# Patient Record
Sex: Female | Born: 1975 | Race: Black or African American | Hispanic: No | Marital: Single | Smoking: Former smoker
Health system: Southern US, Community
[De-identification: ages and names within clinical notes are randomized; demographics above are authoritative.]

## PROBLEM LIST (undated history)

## (undated) DIAGNOSIS — R600 Localized edema: Secondary | ICD-10-CM

## (undated) DIAGNOSIS — Z9109 Other allergy status, other than to drugs and biological substances: Secondary | ICD-10-CM

## (undated) HISTORY — DX: Localized edema: R60.0

## (undated) HISTORY — PX: CORNEAL TRANSPLANT: SHX108

## (undated) HISTORY — PX: CHOLECYSTECTOMY: SHX55

## (undated) HISTORY — PX: ABDOMINAL HYSTERECTOMY: SHX81

---

## 2000-01-10 ENCOUNTER — Emergency Department (HOSPITAL_COMMUNITY): Admission: EM | Admit: 2000-01-10 | Discharge: 2000-01-10 | Payer: Self-pay | Admitting: Emergency Medicine

## 2002-09-18 ENCOUNTER — Inpatient Hospital Stay (HOSPITAL_COMMUNITY): Admission: AD | Admit: 2002-09-18 | Discharge: 2002-09-18 | Payer: Self-pay | Admitting: Obstetrics & Gynecology

## 2004-06-19 ENCOUNTER — Inpatient Hospital Stay (HOSPITAL_COMMUNITY): Admission: AD | Admit: 2004-06-19 | Discharge: 2004-06-19 | Payer: Self-pay | Admitting: Family Medicine

## 2004-07-09 ENCOUNTER — Encounter (INDEPENDENT_AMBULATORY_CARE_PROVIDER_SITE_OTHER): Payer: Self-pay | Admitting: Specialist

## 2004-07-09 ENCOUNTER — Ambulatory Visit: Payer: Self-pay | Admitting: *Deleted

## 2005-12-19 ENCOUNTER — Emergency Department (HOSPITAL_COMMUNITY): Admission: EM | Admit: 2005-12-19 | Discharge: 2005-12-19 | Payer: Self-pay | Admitting: Emergency Medicine

## 2006-12-22 ENCOUNTER — Emergency Department (HOSPITAL_COMMUNITY): Admission: EM | Admit: 2006-12-22 | Discharge: 2006-12-22 | Payer: Self-pay | Admitting: Emergency Medicine

## 2008-02-02 ENCOUNTER — Emergency Department (HOSPITAL_COMMUNITY): Admission: EM | Admit: 2008-02-02 | Discharge: 2008-02-02 | Payer: Self-pay | Admitting: Emergency Medicine

## 2008-05-04 ENCOUNTER — Emergency Department (HOSPITAL_COMMUNITY): Admission: EM | Admit: 2008-05-04 | Discharge: 2008-05-04 | Payer: Self-pay | Admitting: Family Medicine

## 2008-09-04 ENCOUNTER — Encounter (HOSPITAL_COMMUNITY): Payer: Self-pay | Admitting: Family Medicine

## 2008-09-04 ENCOUNTER — Inpatient Hospital Stay (HOSPITAL_COMMUNITY): Admission: AD | Admit: 2008-09-04 | Discharge: 2008-09-04 | Payer: Self-pay | Admitting: Obstetrics & Gynecology

## 2008-09-04 ENCOUNTER — Emergency Department (HOSPITAL_COMMUNITY): Admission: EM | Admit: 2008-09-04 | Discharge: 2008-09-04 | Payer: Self-pay | Admitting: Emergency Medicine

## 2008-09-08 ENCOUNTER — Ambulatory Visit: Payer: Self-pay | Admitting: Obstetrics and Gynecology

## 2008-09-08 ENCOUNTER — Inpatient Hospital Stay (HOSPITAL_COMMUNITY): Admission: AD | Admit: 2008-09-08 | Discharge: 2008-09-08 | Payer: Self-pay | Admitting: Obstetrics & Gynecology

## 2009-04-17 ENCOUNTER — Inpatient Hospital Stay (HOSPITAL_COMMUNITY): Admission: AD | Admit: 2009-04-17 | Discharge: 2009-04-21 | Payer: Self-pay | Admitting: Obstetrics and Gynecology

## 2009-12-04 ENCOUNTER — Ambulatory Visit (HOSPITAL_COMMUNITY): Admission: RE | Admit: 2009-12-04 | Discharge: 2009-12-04 | Payer: Self-pay | Admitting: Obstetrics and Gynecology

## 2009-12-24 ENCOUNTER — Emergency Department (HOSPITAL_COMMUNITY): Admission: EM | Admit: 2009-12-24 | Discharge: 2009-12-24 | Payer: Self-pay | Admitting: Emergency Medicine

## 2010-01-01 ENCOUNTER — Ambulatory Visit (HOSPITAL_COMMUNITY): Admission: RE | Admit: 2010-01-01 | Discharge: 2010-01-02 | Payer: Self-pay | Admitting: Surgery

## 2010-01-01 ENCOUNTER — Encounter (INDEPENDENT_AMBULATORY_CARE_PROVIDER_SITE_OTHER): Payer: Self-pay | Admitting: Surgery

## 2010-01-21 ENCOUNTER — Emergency Department (HOSPITAL_COMMUNITY): Admission: EM | Admit: 2010-01-21 | Discharge: 2010-01-21 | Payer: Self-pay | Admitting: Family Medicine

## 2010-05-20 LAB — SURGICAL PCR SCREEN: MRSA, PCR: NEGATIVE

## 2010-05-20 LAB — DIFFERENTIAL
Basophils Absolute: 0 K/uL (ref 0.0–0.1)
Basophils Relative: 1 % (ref 0–1)
Eosinophils Absolute: 0.3 10*3/uL (ref 0.0–0.7)
Eosinophils Relative: 5 % (ref 0–5)
Lymphocytes Relative: 28 % (ref 12–46)
Lymphs Abs: 1.9 10*3/uL (ref 0.7–4.0)
Monocytes Absolute: 0.5 K/uL (ref 0.1–1.0)
Monocytes Relative: 7 % (ref 3–12)
Neutro Abs: 3.9 10*3/uL (ref 1.7–7.7)
Neutrophils Relative %: 59 % (ref 43–77)

## 2010-05-20 LAB — COMPREHENSIVE METABOLIC PANEL
ALT: 15 U/L (ref 0–35)
Calcium: 8.7 mg/dL (ref 8.4–10.5)
Creatinine, Ser: 0.66 mg/dL (ref 0.4–1.2)
GFR calc Af Amer: >60 mL/min (ref >60)
GFR calc non Af Amer: >60 mL/min (ref >60)
Glucose, Bld: 105 mg/dL — ABNORMAL HIGH (ref 70–99)
Sodium: 138 mEq/L (ref 135–145)
Total Protein: 6.7 g/dL (ref 6.0–8.3)

## 2010-05-20 LAB — URINALYSIS, ROUTINE W REFLEX MICROSCOPIC
Bilirubin Urine: NEGATIVE
Glucose, UA: NEGATIVE mg/dL
Hgb urine dipstick: NEGATIVE
Ketones, ur: NEGATIVE mg/dL
Nitrite: NEGATIVE
Protein, ur: NEGATIVE mg/dL
Specific Gravity, Urine: 1.019 (ref 1.005–1.030)
Urobilinogen, UA: 0.2 mg/dL (ref 0.0–1.0)
pH: 6.5 (ref 5.0–8.0)

## 2010-05-20 LAB — COMPREHENSIVE METABOLIC PANEL WITH GFR
AST: 31 U/L (ref 0–37)
Albumin: 3.4 g/dL — ABNORMAL LOW (ref 3.5–5.2)
Alkaline Phosphatase: 59 U/L (ref 39–117)
BUN: 11 mg/dL (ref 6–23)
CO2: 26 meq/L (ref 19–32)
Chloride: 106 meq/L (ref 96–112)
Potassium: 3.6 meq/L (ref 3.5–5.1)
Total Bilirubin: 0.3 mg/dL (ref 0.3–1.2)

## 2010-05-20 LAB — CBC
HCT: 36.4 % (ref 36.0–46.0)
Hemoglobin: 12.3 g/dL (ref 12.0–15.0)
MCH: 30.3 pg (ref 26.0–34.0)
MCHC: 33.9 g/dL (ref 30.0–36.0)
MCV: 89.5 fL (ref 78.0–100.0)
Platelets: 263 K/uL (ref 150–400)
RBC: 4.06 MIL/uL (ref 3.87–5.11)
RDW: 15.9 % — ABNORMAL HIGH (ref 11.5–15.5)
WBC: 6.7 10*3/uL (ref 4.0–10.5)

## 2010-05-20 LAB — URINE MICROSCOPIC-ADD ON

## 2010-05-20 LAB — PREGNANCY, URINE: Preg Test, Ur: NEGATIVE

## 2010-05-20 LAB — LIPASE, BLOOD: Lipase: 22 U/L (ref 11–59)

## 2010-05-27 LAB — CBC
HCT: 28.2 % — ABNORMAL LOW (ref 36.0–46.0)
HCT: 34 % — ABNORMAL LOW (ref 36.0–46.0)
Hemoglobin: 11.4 g/dL — ABNORMAL LOW (ref 12.0–15.0)
MCHC: 33.5 g/dL (ref 30.0–36.0)
MCHC: 33.6 g/dL (ref 30.0–36.0)
MCV: 92.6 fL (ref 78.0–100.0)
Platelets: 139 10*3/uL — ABNORMAL LOW (ref 150–400)
RBC: 3.71 MIL/uL — ABNORMAL LOW (ref 3.87–5.11)
RDW: 15.6 % — ABNORMAL HIGH (ref 11.5–15.5)
WBC: 12.7 10*3/uL — ABNORMAL HIGH (ref 4.0–10.5)

## 2010-06-14 LAB — HCG, QUANTITATIVE, PREGNANCY: hCG, Beta Chain, Quant, S: 18095 m[IU]/mL — ABNORMAL HIGH (ref <5)

## 2010-06-15 LAB — POCT URINALYSIS DIP (DEVICE)
Bilirubin Urine: NEGATIVE
Ketones, ur: NEGATIVE mg/dL
Nitrite: NEGATIVE
Protein, ur: NEGATIVE mg/dL
pH: 6 (ref 5.0–8.0)

## 2010-06-15 LAB — URINE CULTURE: Colony Count: 1000

## 2010-06-15 LAB — CBC
Hemoglobin: 12.4 g/dL (ref 12.0–15.0)
MCHC: 34.4 g/dL (ref 30.0–36.0)
MCV: 92.9 fL (ref 78.0–100.0)
RBC: 3.88 MIL/uL (ref 3.87–5.11)
WBC: 7.6 10*3/uL (ref 4.0–10.5)

## 2010-06-15 LAB — HCG, QUANTITATIVE, PREGNANCY: hCG, Beta Chain, Quant, S: 7878 m[IU]/mL — ABNORMAL HIGH (ref <5)

## 2010-06-15 LAB — WET PREP, GENITAL: Yeast Wet Prep HPF POC: NONE SEEN

## 2010-06-15 LAB — GC/CHLAMYDIA PROBE AMP, GENITAL: Chlamydia, DNA Probe: NEGATIVE

## 2010-06-23 LAB — POCT INFECTIOUS MONO SCREEN: Mono Screen: POSITIVE — AB

## 2011-02-03 ENCOUNTER — Emergency Department (HOSPITAL_COMMUNITY)
Admission: EM | Admit: 2011-02-03 | Discharge: 2011-02-03 | Disposition: A | Discharge status: Home / Self Care | Payer: PRIVATE HEALTH INSURANCE | Attending: Emergency Medicine | Admitting: Emergency Medicine

## 2011-02-03 ENCOUNTER — Encounter: Payer: Self-pay | Admitting: *Deleted

## 2011-02-03 DIAGNOSIS — F17200 Nicotine dependence, unspecified, uncomplicated: Secondary | ICD-10-CM | POA: Insufficient documentation

## 2011-02-03 DIAGNOSIS — W260XXA Contact with knife, initial encounter: Secondary | ICD-10-CM | POA: Insufficient documentation

## 2011-02-03 DIAGNOSIS — S61219A Laceration without foreign body of unspecified finger without damage to nail, initial encounter: Secondary | ICD-10-CM

## 2011-02-03 DIAGNOSIS — Y9229 Other specified public building as the place of occurrence of the external cause: Secondary | ICD-10-CM | POA: Insufficient documentation

## 2011-02-03 DIAGNOSIS — W261XXA Contact with sword or dagger, initial encounter: Secondary | ICD-10-CM | POA: Insufficient documentation

## 2011-02-03 DIAGNOSIS — S61209A Unspecified open wound of unspecified finger without damage to nail, initial encounter: Secondary | ICD-10-CM | POA: Insufficient documentation

## 2011-02-03 MED ORDER — BACITRACIN 500 UNIT/GM EX OINT
1.0000 "application " | TOPICAL_OINTMENT | Freq: Every day | CUTANEOUS | Status: DC
  Filled 2011-02-03: qty 0.9

## 2011-02-03 MED ORDER — LIDOCAINE HCL (PF) 1 % IJ SOLN
20.0000 mL | Freq: Once | INTRAMUSCULAR | Status: AC
  Administered 2011-02-03: 20 mL
  Filled 2011-02-03: qty 20

## 2011-02-03 MED ORDER — BACITRACIN ZINC 500 UNIT/GM EX OINT
1.0000 "application " | TOPICAL_OINTMENT | Freq: Every day | CUTANEOUS | Status: DC
  Administered 2011-02-03: 1 via TOPICAL

## 2011-02-03 MED ORDER — TETANUS-DIPHTH-ACELL PERTUSSIS 5-2.5-18.5 LF-MCG/0.5 IM SUSP
0.5000 mL | Freq: Once | INTRAMUSCULAR | Status: AC
  Administered 2011-02-03: 0.5 mL via INTRAMUSCULAR
  Filled 2011-02-03: qty 0.5

## 2011-02-03 MED ORDER — ACETAMINOPHEN 325 MG PO TABS
650.0000 mg | ORAL_TABLET | Freq: Once | ORAL | Status: AC
  Administered 2011-02-03: 650 mg via ORAL
  Filled 2011-02-03: qty 2

## 2011-02-03 MED ORDER — BACITRACIN 500 UNIT/GM EX OINT
1.0000 "application " | TOPICAL_OINTMENT | CUTANEOUS | Status: DC
  Filled 2011-02-03: qty 0.9

## 2011-02-03 NOTE — ED Provider Notes (Signed)
History     CSN: [id number] Arrival date & time: 02/03/2011  1:32 PM   First MD Initiated Contact with Patient 02/03/11 1536      Chief Complaint  Patient presents with  . Extremity Laceration    (Consider location/radiation/quality/duration/timing/severity/associated sxs/prior treatment) HPI Comments: Patient reports she was working in [employer] and cut her finger with a knife.  Denies other injury. Denies weakness, numbness, difficulty moving finger.  Pain radiates up entire arm.  Last tetanus was Oct 2007.   The history is provided by the patient.    History reviewed. No pertinent past medical history.  Past Surgical History  Procedure Date  . Cesarean section   . Corneal transplant   . Cholecystectomy     No family history on file.  History  Substance Use Topics  . Smoking status: Current Everyday Smoker -- 0.5 packs/day  . Smokeless tobacco: Not on file  . Alcohol Use: Yes     socially    OB History    Grav Para Term Preterm Abortions TAB SAB Ect Mult Living                  Review of Systems  All other systems reviewed and are negative.    Allergies  Shellfish allergy and Aspirin  Home Medications  No current outpatient prescriptions on file.  BP 103/67  Pulse 77  Temp(Src) 98.6 F (37 C) (Oral)  Resp 18  Wt 267 lb (121.11 kg)  SpO2 99%  LMP 01/20/2011  Physical Exam  Constitutional: She is oriented to person, place, and time. She appears well-developed and well-nourished.  HENT:  Head: Normocephalic and atraumatic.  Neck: Neck supple.  Pulmonary/Chest: Effort normal.  Neurological: She is alert and oriented to person, place, and time.  Skin:       Simple linear laceration over dorsal right 3rd digit, proximal phalanx.  Full ROM of digit, full strength, capillary refill < 2 seconds.      ED Course  Procedures (including critical care time) LACERATION REPAIR Performed by: Rise Patience Consent: Verbal consent obtained. Risks and  benefits: risks, benefits and alternatives were discussed Patient identity confirmed: provided demographic data Time out performed prior to procedure Prepped and Draped in normal sterile fashion Wound explored  Laceration Location: right dorsal 3rd finger  Laceration Length: 2cm  No Foreign Bodies seen or palpated  Anesthesia: digital block  Local anesthetic: lidocaine 1% NO epinephrine  Irrigation method: syringe Amount of cleaning: standard  Skin closure: ethilon 5-0  Number of sutures or staples: 5  Technique: simple interrupted  Patient tolerance: Patient tolerated the procedure well with no immediate complications.    Labs Reviewed - No data to display No results found.   1. 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  Pt is right hand dominant, no tendon injury.  Laceration is simple, pt tolerated repair well.          Dillard Cannon [place], [place] 02/03/11 [phone]

## 2011-02-03 NOTE — ED Notes (Signed)
Pt states "the knife slipped out of my hand and cut the other hand"; pt from hospital dining; pt presents with laceration to right hand, 3rd digit posteriorly, bleeding controlled @ present

## 2011-02-04 NOTE — ED Provider Notes (Signed)
Medical screening examination/treatment/procedure(s) were performed by non-physician practitioner and as supervising physician I was immediately available for consultation/collaboration.   Adlee Paar, MD 02/04/11 0651 

## 2011-02-13 ENCOUNTER — Emergency Department (HOSPITAL_COMMUNITY)
Admission: EM | Admit: 2011-02-13 | Discharge: 2011-02-13 | Discharge status: Against Medical Advice | Payer: PRIVATE HEALTH INSURANCE

## 2011-02-14 ENCOUNTER — Emergency Department (HOSPITAL_COMMUNITY)
Admission: EM | Admit: 2011-02-14 | Discharge: 2011-02-14 | Disposition: A | Discharge status: Home / Self Care | Payer: PRIVATE HEALTH INSURANCE | Attending: Emergency Medicine | Admitting: Emergency Medicine

## 2011-02-14 ENCOUNTER — Encounter (HOSPITAL_COMMUNITY): Payer: Self-pay | Admitting: *Deleted

## 2011-02-14 DIAGNOSIS — IMO0002 Reserved for concepts with insufficient information to code with codable children: Secondary | ICD-10-CM

## 2011-02-14 DIAGNOSIS — Z4802 Encounter for removal of sutures: Secondary | ICD-10-CM | POA: Insufficient documentation

## 2011-02-14 NOTE — ED Notes (Signed)
Suture removal rt middle finger

## 2011-02-14 NOTE — ED Provider Notes (Signed)
History     CSN: [id number] Arrival date & time: 02/14/2011  2:30 PM   First MD Initiated Contact with Patient 02/14/11 1509      Chief Complaint  Patient presents with  . Suture / Staple Removal     HPI Reports here for suture removal. States was seen 02/03/2011 and had 5 sutures placed to her (R) middle finger s/p lac sustained w/ knife while cutting food.   History reviewed. No pertinent past medical history.  Past Surgical History  Procedure Date  . Cesarean section   . Corneal transplant   . Cholecystectomy     No family history on file.  History  Substance Use Topics  . Smoking status: Current Everyday Smoker -- 0.5 packs/day  . Smokeless tobacco: Not on file  . Alcohol Use: Yes     socially    OB History    Grav Para Term Preterm Abortions TAB SAB Ect Mult Living                  Review of Systems  Constitutional: Negative.   HENT: Negative.   Eyes: Negative.   Respiratory: Negative.   Cardiovascular: Negative.   Gastrointestinal: Negative.   Genitourinary: Negative.   Musculoskeletal: Negative.   Skin: Negative.   Neurological: Negative.   Hematological: Negative.   Psychiatric/Behavioral: Negative.     Allergies  Shellfish allergy and Aspirin  Home Medications  No current outpatient prescriptions on file.  BP 132/77  Pulse 86  Temp(Src) 97.7 F (36.5 C) (Oral)  SpO2 100%  LMP 01/20/2011  Physical Exam  Constitutional: She is oriented to person, place, and time. She appears well-developed and well-nourished.  HENT:  Head: Normocephalic and atraumatic.  Eyes: Conjunctivae are normal.  Neck: Neck supple.  Musculoskeletal: Normal range of motion.       Hands:      Well healed wound to dorsal (R) 3rd finger w/ 5 sutures in place.  Neurological: She is alert and oriented to person, place, and time. She has normal reflexes.  Skin: Skin is warm and dry.  Psychiatric: She has a normal mood and affect.    ED Course  SUTURE  REMOVAL Date/Time: 02/14/2011 3:38 PM Performed by: Leanne Chang Authorized by: Leanne Chang Consent: Verbal consent obtained. Consent given by: patient Patient understanding: patient states understanding of the procedure being performed Body area: upper extremity Location details: right hand Wound Appearance: clean Sutures Removed: 5 Post-removal: antibiotic ointment applied and dressing applied   Well healed wound to (R) 3rd finger.   Labs Reviewed - No data to display No results found.   No diagnosis found.    MDM  Well healed wound s/p suturing on 02/03/2011. Sutures removed.        Leanne Chang, NP 02/14/11 1540

## 2011-02-15 NOTE — ED Provider Notes (Signed)
Evaluation and management procedures were performed by the mid-level provider (PA/NP/CNM) under my supervision/collaboration. I was present and available during the ED course. Joei Frangos Y.   Brewster Wolters Y. Reginna Sermeno, MD 02/15/11 1146 

## 2011-03-28 ENCOUNTER — Emergency Department (INDEPENDENT_AMBULATORY_CARE_PROVIDER_SITE_OTHER)
Admission: EM | Admit: 2011-03-28 | Discharge: 2011-03-28 | Disposition: A | Discharge status: Home / Self Care | Payer: PRIVATE HEALTH INSURANCE | Source: Home / Self Care

## 2011-03-28 ENCOUNTER — Encounter (HOSPITAL_COMMUNITY): Payer: Self-pay | Admitting: *Deleted

## 2011-03-28 DIAGNOSIS — B349 Viral infection, unspecified: Secondary | ICD-10-CM

## 2011-03-28 DIAGNOSIS — B9789 Other viral agents as the cause of diseases classified elsewhere: Secondary | ICD-10-CM

## 2011-03-28 MED ORDER — OSELTAMIVIR PHOSPHATE 75 MG PO CAPS: 75.0000 mg | ORAL_CAPSULE | Freq: Two times a day (BID) | ORAL | Status: AC

## 2011-03-28 NOTE — ED Provider Notes (Signed)
History     CSN: [id number]  Arrival date & time 03/28/11  1735   None     Chief Complaint  Patient presents with  . Diarrhea  . Chills  . Generalized Body Aches    (Consider location/radiation/quality/duration/timing/severity/associated sxs/prior treatment) HPI Comments: Pt became ill 2 days ago, had flu shot this year but feels like has flu.  Son was in hospital 2 night ago, so pt ignored own sx.   Patient is a 36 y.o. female presenting with URI. The history is provided by the patient.  URI The primary symptoms include fever, headaches, sore throat, cough and myalgias. Primary symptoms do not include ear pain, wheezing, abdominal pain, nausea or vomiting. The current episode started 2 days ago. This is a new problem.  The fever began 2 days ago. The maximum temperature recorded prior to her arrival was unknown.  The headache began 2 days ago. Headache is a new problem. The pain from the headache is at a severity of 8/10.  The sore throat began 2 days ago. The sore throat is moderate in intensity.  The cough began 2 days ago. The cough is non-productive.  The onset of the illness is associated with exposure to sick contacts. Symptoms associated with the illness include chills, sinus pressure and congestion.    Past Medical History  Diagnosis Date  . Asthma     Past Surgical History  Procedure Date  . Cesarean section   . Corneal transplant   . Cholecystectomy     History reviewed. No pertinent family history.  History  Substance Use Topics  . Smoking status: Current Everyday Smoker -- 0.5 packs/day  . Smokeless tobacco: Not on file  . Alcohol Use: Yes     socially    OB History    Grav Para Term Preterm Abortions TAB SAB Ect Mult Living                  Review of Systems  Constitutional: Positive for fever and chills.  HENT: Positive for congestion, sore throat, postnasal drip and sinus pressure. Negative for ear pain.   Respiratory: Positive for cough.  Negative for wheezing.   Cardiovascular: Positive for chest pain.       With coughing  Gastrointestinal: Positive for diarrhea. Negative for nausea, vomiting and abdominal pain.  Musculoskeletal: Positive for myalgias.  Neurological: Positive for headaches.    Allergies  Shellfish allergy and Aspirin  Home Medications   Current Outpatient Rx  Name Route Sig Dispense Refill  . PREDNISOLONE ACETATE 1 % OP SUSP  1 drop 3 (three) times daily.      BP 130/80  Pulse 94  Temp(Src) 99.9 F (37.7 C) (Oral)  Resp 18  SpO2 98%  LMP 03/22/2011  Physical Exam  Constitutional: She appears well-developed and well-nourished. She appears ill.  Cardiovascular: Normal rate and regular rhythm.   Pulmonary/Chest: Effort normal and breath sounds normal.       coughing  Abdominal: There is no tenderness.  Lymphadenopathy:       Head (right side): Submandibular adenopathy present.       Head (left side): Submandibular adenopathy present.    She has no cervical adenopathy.    ED Course  Procedures (including critical care time)  Although pt received flu shot this year, sx c/w flu. Per CDC recommendations this year, will tx with tamiflu.    Labs Reviewed - No data to display No results found.   No diagnosis found.  MDM          Cathlyn Parsons, NP 03/28/11 1844

## 2011-03-28 NOTE — ED Notes (Signed)
Started w/ body aches and chills 3 days ago w/ cough and sneezing.  C/O chest pain, sore throat, diarrhea, poor appetite.  Unsure how high fevers have been.  Has been taken one dose Mucinex.

## 2011-03-30 NOTE — ED Provider Notes (Signed)
Medical screening examination/treatment/procedure(s) were performed by non-physician practitioner and as supervising physician I was immediately available for consultation/collaboration.   Tyresha Fede DOUGLAS MD.    Joslynne Klatt Douglas Natiya Seelinger, MD 03/30/11 0811 

## 2012-02-01 ENCOUNTER — Encounter (HOSPITAL_COMMUNITY): Payer: Self-pay | Admitting: *Deleted

## 2012-02-01 ENCOUNTER — Inpatient Hospital Stay (HOSPITAL_COMMUNITY): Payer: 59

## 2012-02-01 ENCOUNTER — Inpatient Hospital Stay (HOSPITAL_COMMUNITY)
Admission: AD | Admit: 2012-02-01 | Discharge: 2012-02-01 | Disposition: A | Discharge status: Home / Self Care | Payer: 59 | Source: Ambulatory Visit | Attending: Obstetrics & Gynecology | Admitting: Obstetrics & Gynecology

## 2012-02-01 DIAGNOSIS — N939 Abnormal uterine and vaginal bleeding, unspecified: Secondary | ICD-10-CM

## 2012-02-01 DIAGNOSIS — D259 Leiomyoma of uterus, unspecified: Secondary | ICD-10-CM

## 2012-02-01 DIAGNOSIS — N938 Other specified abnormal uterine and vaginal bleeding: Secondary | ICD-10-CM | POA: Insufficient documentation

## 2012-02-01 DIAGNOSIS — R109 Unspecified abdominal pain: Secondary | ICD-10-CM | POA: Insufficient documentation

## 2012-02-01 DIAGNOSIS — N949 Unspecified condition associated with female genital organs and menstrual cycle: Secondary | ICD-10-CM | POA: Insufficient documentation

## 2012-02-01 LAB — URINALYSIS, ROUTINE W REFLEX MICROSCOPIC
Ketones, ur: NEGATIVE mg/dL
Leukocytes, UA: NEGATIVE
Nitrite: NEGATIVE
Urobilinogen, UA: 0.2 mg/dL (ref 0.0–1.0)
pH: 5.5 (ref 5.0–8.0)

## 2012-02-01 LAB — CBC
HCT: 34.6 % — ABNORMAL LOW (ref 36.0–46.0)
Hemoglobin: 11.5 g/dL — ABNORMAL LOW (ref 12.0–15.0)
MCV: 87.4 fL (ref 78.0–100.0)
RBC: 3.96 MIL/uL (ref 3.87–5.11)
RDW: 15.6 % — ABNORMAL HIGH (ref 11.5–15.5)
WBC: 6.8 10*3/uL (ref 4.0–10.5)

## 2012-02-01 LAB — URINE MICROSCOPIC-ADD ON

## 2012-02-01 LAB — WET PREP, GENITAL: Yeast Wet Prep HPF POC: NONE SEEN

## 2012-02-01 MED ORDER — OXYCODONE-ACETAMINOPHEN 5-325 MG PO TABS
1.0000 | ORAL_TABLET | Freq: Once | ORAL | Status: AC
  Administered 2012-02-01: 1 via ORAL
  Filled 2012-02-01: qty 1

## 2012-02-01 MED ORDER — NORGESTIMATE-ETH ESTRADIOL 0.25-35 MG-MCG PO TABS: 1.0000 | ORAL_TABLET | Freq: Every day | ORAL | Status: DC

## 2012-02-01 MED ORDER — OXYCODONE-ACETAMINOPHEN 5-325 MG PO TABS: 1.0000 | ORAL_TABLET | ORAL | Status: DC | PRN

## 2012-02-01 NOTE — MAU Note (Signed)
Pt presents with complaint of irregular vaginal bleeding. States LMP was 10/18-10/25, stopped for 4 days and then started bleeding again and has had bleeding every day since, states passing clots at times. Today has used 3 tampons. Lower abd pain x 3-4 days off/on.

## 2012-02-01 NOTE — MAU Provider Note (Signed)
History     CSN: [id number]  Arrival date and time: 02/01/12 2043   None     Chief Complaint  Patient presents with  . Vaginal Bleeding  . Abdominal Pain   HPI Julie Munoz is a 36 y.o. female who presents to MAU with vaginal bleeding. The bleeding started 3 weeks ago. She states she had a normal period 10/18 and then a few days later started bleeding again. Some days the bleeding is heavy and some days not.  She has been using 3 to 4 tampons a day. Associated symptoms include abdominal pain, headache and feeling weak.  Last pap smear about 2 years ago and was normal. Current sex partner x 4 months. No history of STI's but request testing tonight.   OB History    Grav Para Term Preterm Abortions TAB SAB Ect Mult Living   1 1        1       Past Medical History  Diagnosis Date  . Asthma     Past Surgical History  Procedure Date  . Cesarean section   . Corneal transplant   . Cholecystectomy     History reviewed. No pertinent family history.  History  Substance Use Topics  . Smoking status: Current Every Day Smoker -- 0.5 packs/day  . Smokeless tobacco: Not on file  . Alcohol Use: Yes     Comment: socially    Allergies:  Allergies  Allergen Reactions  . Shellfish Allergy Anaphylaxis  . Aspirin Other (See Comments)    Allergy unknown    Prescriptions prior to admission  Medication Sig Dispense Refill  . prednisoLONE acetate (PRED FORTE) 1 % ophthalmic suspension 1 drop 3 (three) times daily.        Review of Systems  Constitutional: Negative for fever, chills, weight loss and malaise/fatigue (tired).  HENT: Negative for ear pain, nosebleeds, congestion, sore throat and neck pain.   Eyes: Negative for blurred vision, double vision, photophobia and pain.  Respiratory: Negative for cough, shortness of breath and wheezing.   Cardiovascular: Negative for chest pain, palpitations and leg swelling.  Gastrointestinal: Positive for abdominal pain. Negative  for heartburn, nausea, vomiting, diarrhea and constipation.  Genitourinary: Negative for dysuria, urgency and frequency.  Musculoskeletal: Positive for back pain. Negative for myalgias.  Skin: Negative for itching and rash.  Neurological: Positive for headaches. Negative for dizziness, sensory change, speech change, seizures and weakness.  Endo/Heme/Allergies: Does not bruise/bleed easily.  Psychiatric/Behavioral: Negative for depression and substance abuse. The patient is not nervous/anxious and does not have insomnia.    Physical Exam   Blood pressure 113/69, pulse 76, temperature 98.4 F (36.9 C), temperature source Oral, resp. rate 18, height 5\' 6"  (1.676 m), weight 271 lb (122.925 kg), last menstrual period 12/24/2011, SpO2 99.00%.  Physical Exam  Nursing note and vitals reviewed. Constitutional: She is oriented to person, place, and time. No distress.       Obese A/A female  HENT:  Head: Normocephalic and atraumatic.  Eyes: EOM are normal.  Neck: Neck supple.  Cardiovascular: Normal rate.   Respiratory: Effort normal.  GI: Soft. There is no tenderness.  Genitourinary:       External genitalia without lesions. Scant blood vaginal vault. Positive CMT, bilateral adnexal tenderness. Unable to palpate uterus due to patient habitus  Musculoskeletal: Normal range of motion.  Neurological: She is alert and oriented to person, place, and time.  Skin: Skin is warm and dry.  Psychiatric: She has a normal  mood and affect. Her behavior is normal. Judgment and thought content normal.   Results for orders placed during the hospital encounter of 02/01/12 (from the past 24 hour(s))  URINALYSIS, ROUTINE W REFLEX MICROSCOPIC     Status: Abnormal   Collection Time   02/01/12  9:00 PM      Component Value Range   Color, Urine YELLOW  YELLOW   APPearance CLEAR  CLEAR   Specific Gravity, Urine 1.025  1.005 - 1.030   pH 5.5  5.0 - 8.0   Glucose, UA NEGATIVE  NEGATIVE mg/dL   Hgb urine dipstick  LARGE (*) NEGATIVE   Bilirubin Urine NEGATIVE  NEGATIVE   Ketones, ur NEGATIVE  NEGATIVE mg/dL   Protein, ur NEGATIVE  NEGATIVE mg/dL   Urobilinogen, UA 0.2  0.0 - 1.0 mg/dL   Nitrite NEGATIVE  NEGATIVE   Leukocytes, UA NEGATIVE  NEGATIVE  URINE MICROSCOPIC-ADD ON     Status: Abnormal   Collection Time   02/01/12  9:00 PM      Component Value Range   Squamous Epithelial / LPF FEW (*) RARE   WBC, UA 0-2  <3 WBC/hpf   RBC / HPF 0-2  <3 RBC/hpf   Bacteria, UA RARE  RARE  CBC     Status: Abnormal   Collection Time   02/01/12  9:15 PM      Component Value Range   WBC 6.8  4.0 - 10.5 K/uL   RBC 3.96  3.87 - 5.11 MIL/uL   Hemoglobin 11.5 (*) 12.0 - 15.0 g/dL   HCT 16.1 (*) 09.6 - 04.5 %   MCV 87.4  78.0 - 100.0 fL   MCH 29.0  26.0 - 34.0 pg   MCHC 33.2  30.0 - 36.0 g/dL   RDW 40.9 (*) 81.1 - 91.4 %   Platelets 252  150 - 400 K/uL  WET PREP, GENITAL     Status: Abnormal   Collection Time   02/01/12  9:55 PM      Component Value Range   Yeast Wet Prep HPF POC NONE SEEN  NONE SEEN   Trich, Wet Prep NONE SEEN  NONE SEEN   Clue Cells Wet Prep HPF POC FEW (*) NONE SEEN   WBC, Wet Prep HPF POC FEW (*) NONE SEEN    US Transvaginal Non-ob  02/01/2012  *RADIOLOGY REPORT*  Clinical Data: Pelvic pain.  Vaginal bleeding.  TRANSABDOMINAL AND TRANSVAGINAL ULTRASOUND OF PELVIS Technique:  Both transabdominal and transvaginal ultrasound examinations of the pelvis were performed. Transabdominal technique was performed for global imaging of the pelvis including uterus, ovaries, adnexal regions, and pelvic cul-de-sac.  It was necessary to proceed with endovaginal exam following the transabdominal exam to visualize the uterus and ovaries.  Comparison:  No priors.  Findings:  Uterus: 9.4 x 5.2 x 6.8 cm.  Large heterogeneously isoechoic lesion and measuring 3.0 x 3.9 x 3.4 cm in the mid body distorting the endometrial stripe, most compatible with a large submucosal fibroid.  Endometrium: Measures up to 9.7  mm in thickness.  Distorted by the large submucosal fibroid.  Right ovary:  Cannot be visualized.  Left ovary: Normal in echotexture and appearance measuring 4.3 x 3.0 x 2.2 cm.  Small follicles.  Other findings: No significant free fluid in the cul-de-sac.  IMPRESSION: 1.  Large submucosal fibroid in the mid body of the uterus. 2.  Left ovary is normal in appearance. 3.  Limited examination which did not visualize the right ovary.  Original Report Authenticated By: Trudie Reed, M.D.    US Pelvis Complete  02/01/2012  *RADIOLOGY REPORT*  Clinical Data: Pelvic pain.  Vaginal bleeding.  TRANSABDOMINAL AND TRANSVAGINAL ULTRASOUND OF PELVIS Technique:  Both transabdominal and transvaginal ultrasound examinations of the pelvis were performed. Transabdominal technique was performed for global imaging of the pelvis including uterus, ovaries, adnexal regions, and pelvic cul-de-sac.  It was necessary to proceed with endovaginal exam following the transabdominal exam to visualize the uterus and ovaries.  Comparison:  No priors.  Findings:  Uterus: 9.4 x 5.2 x 6.8 cm.  Large heterogeneously isoechoic lesion and measuring 3.0 x 3.9 x 3.4 cm in the mid body distorting the endometrial stripe, most compatible with a large submucosal fibroid.  Endometrium: Measures up to 9.7 mm in thickness.  Distorted by the large submucosal fibroid.  Right ovary:  Cannot be visualized.  Left ovary: Normal in echotexture and appearance measuring 4.3 x 3.0 x 2.2 cm.  Small follicles.  Other findings: No significant free fluid in the cul-de-sac.  IMPRESSION: 1.  Large submucosal fibroid in the mid body of the uterus. 2.  Left ovary is normal in appearance. 3.  Limited examination which did not visualize the right ovary.   Original Report Authenticated By: Trudie Reed, M.D.     Assessment: 36 y.o. female with abnormal vaginal bleeding and pelvic pain   Uterine fibroid  Plan:  OC's and follow up in the GYN Clinic in 2  weeks   Discussed with Dr. Debroah Loop  I have reviewed this patient's vital signs, nurses notes, appropriate labs and imaging. I have discussed the results with the patient and plan of care and she voices understanding.   Medication List     As of 02/01/2012 10:56 PM    START taking these medications         norgestimate-ethinyl estradiol 0.25-35 MG-MCG tablet   Commonly known as: ORTHO-CYCLEN,SPRINTEC,PREVIFEM   Take 1 tablet by mouth daily.      CONTINUE taking these medications         HYDROcodone-acetaminophen 5-325 MG per tablet   Commonly known as: NORCO/VICODIN      oxyCODONE-acetaminophen 5-325 MG per tablet   Commonly known as: PERCOCET/ROXICET      prednisoLONE acetate 1 % ophthalmic suspension   Commonly known as: PRED FORTE          Where to get your medications    These are the prescriptions that you need to pick up. We sent them to a specific pharmacy, so you will need to go there to get them.   [facility] - [place], [place] - [address]    [address] [place] [address]    Phone: [phone]        norgestimate-ethinyl estradiol 0.25-35 MG-MCG tablet            MAU Course  Procedures Jaeson Molstad, RN, FNP, [facility] 02/01/2012, 9:25 PM

## 2012-02-02 ENCOUNTER — Encounter: Payer: Self-pay | Admitting: Obstetrics & Gynecology

## 2012-02-07 IMAGING — US US ABDOMEN COMPLETE
1 series · 13 of 25 positions shown · non-contrast
Comparison: None.

CLINICAL DATA: Right upper quadrant pain for 4 months.  Family
history of gallstones

COMPLETE ABDOMINAL ULTRASOUND

[Series 1: us abdomen complete · 0.26mm/px · 13 of 74 slices shown]
[im 1/74]
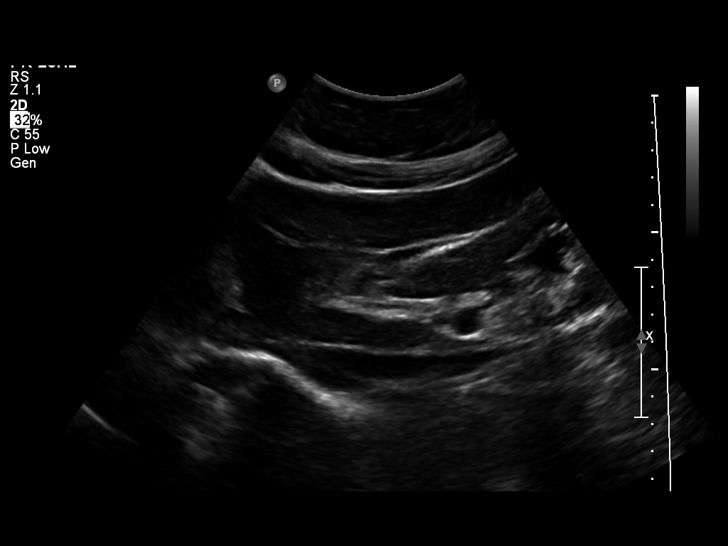
[im 7/74]
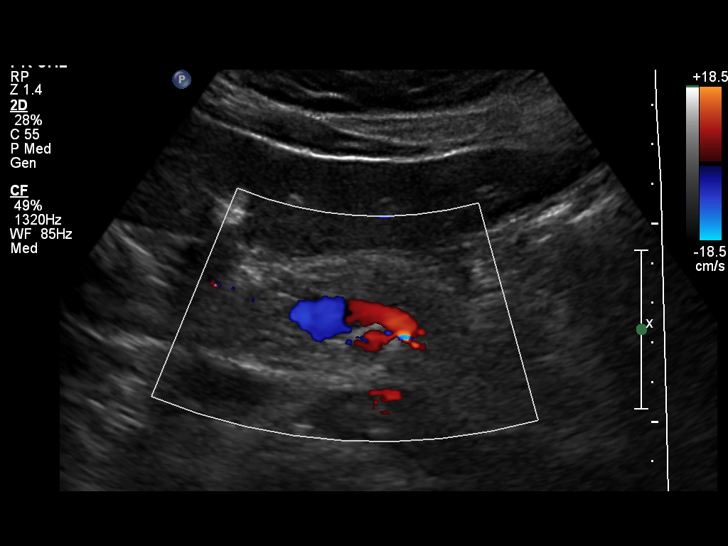
[im 13/74]
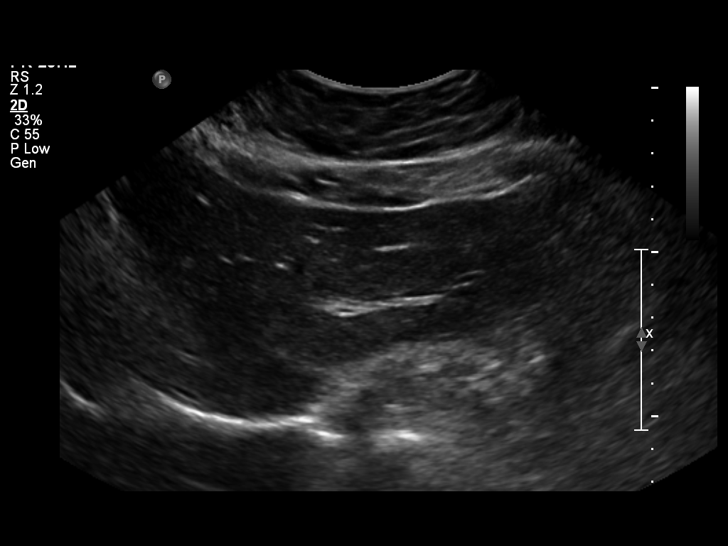
[im 19/74]
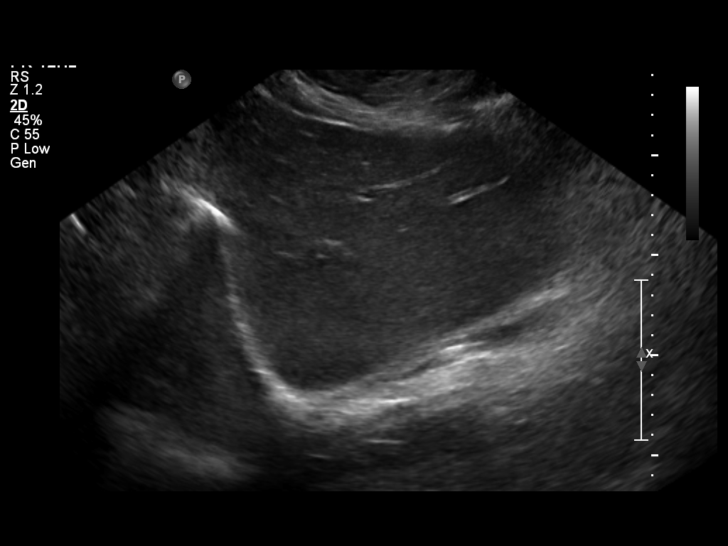
[im 25/74]
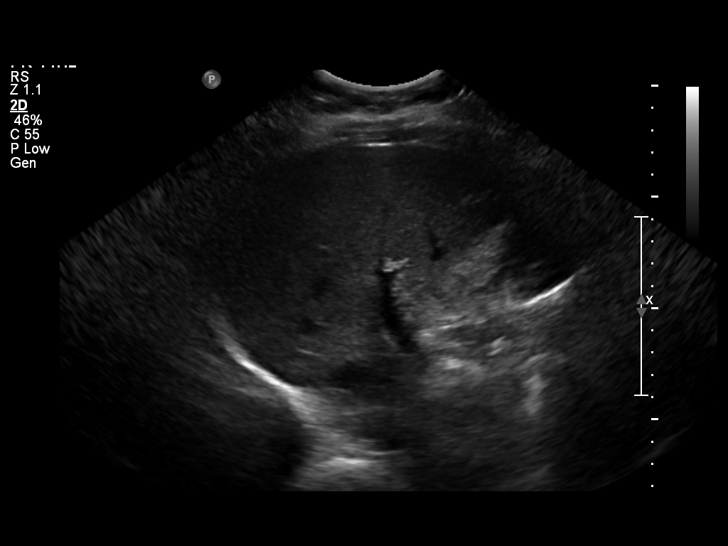
[im 31/74]
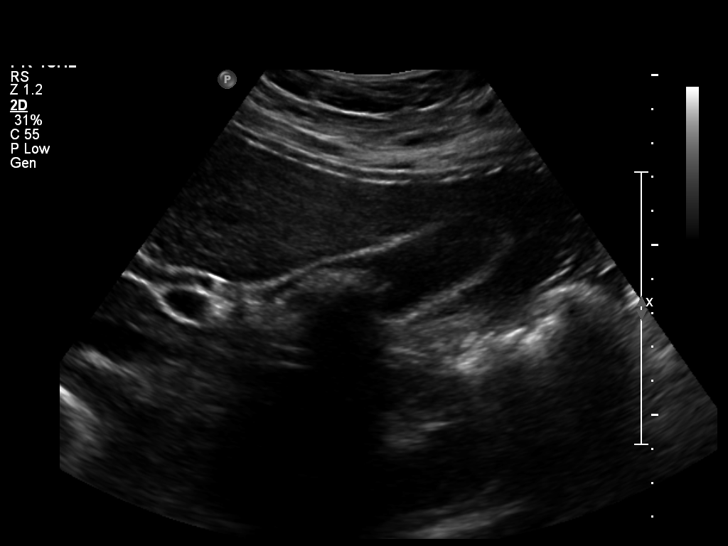
[im 37/74]
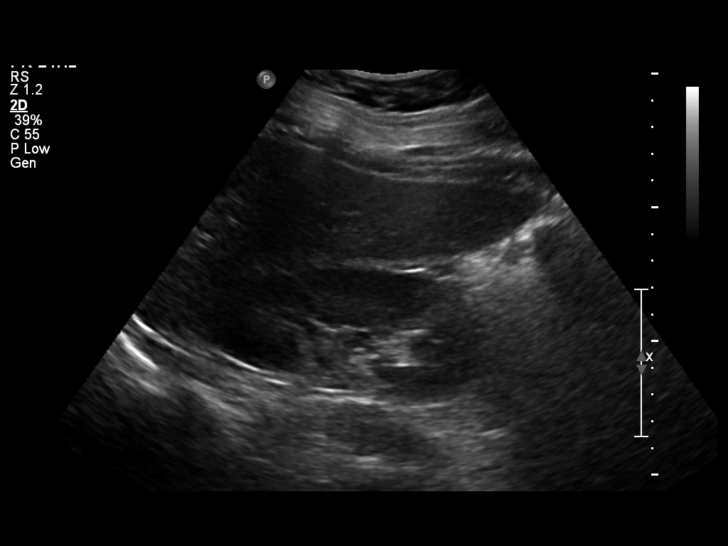
[im 43/74]
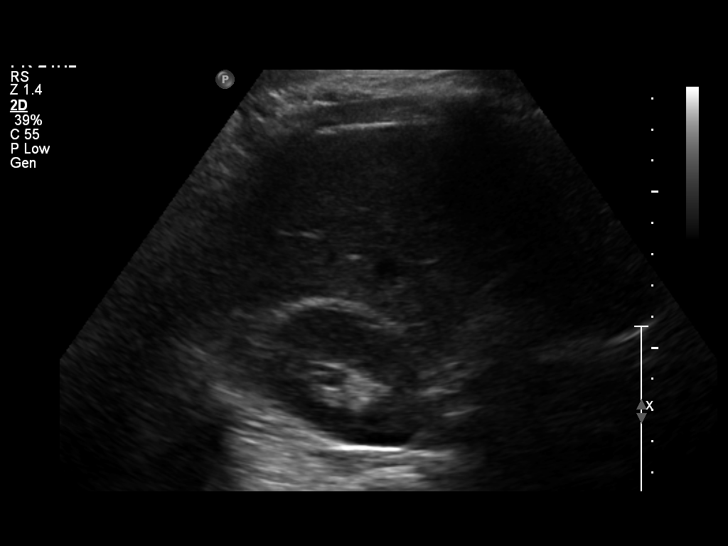
[im 49/74]
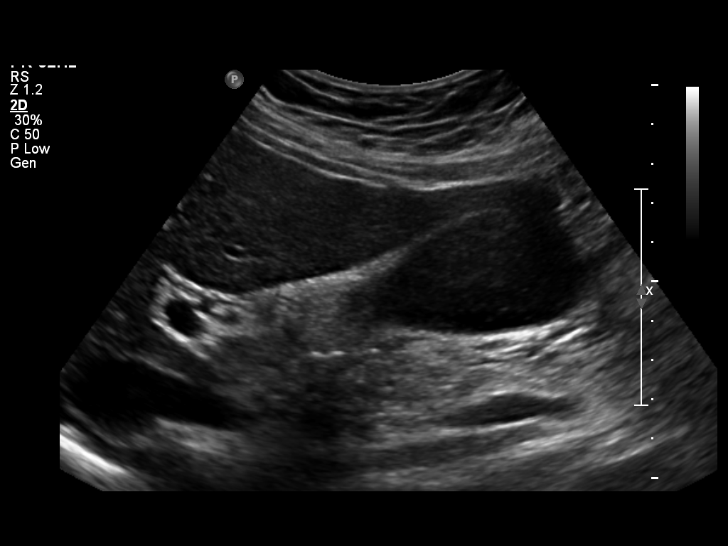
[im 55/74]
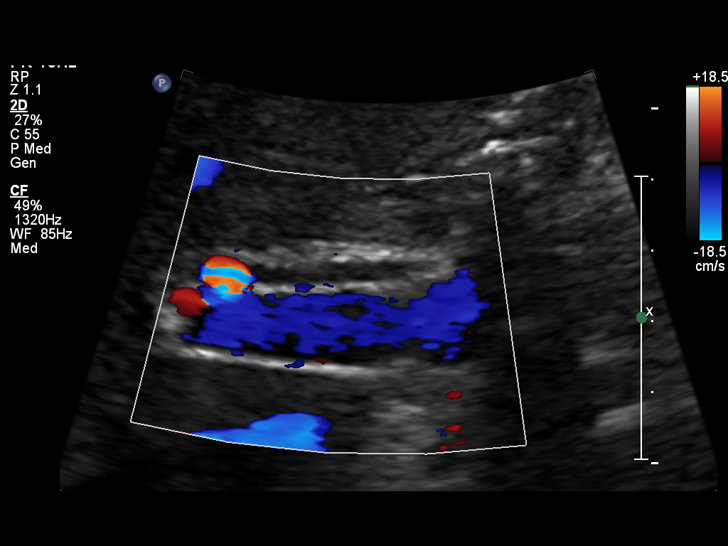
[im 61/74]
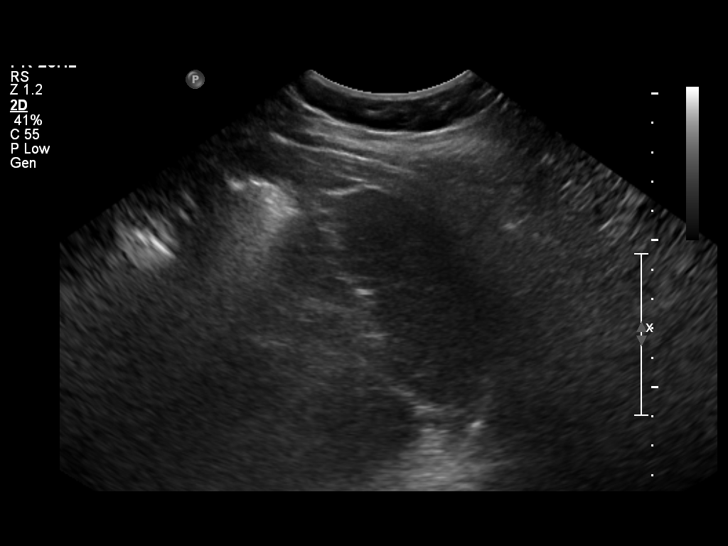
[im 67/74]
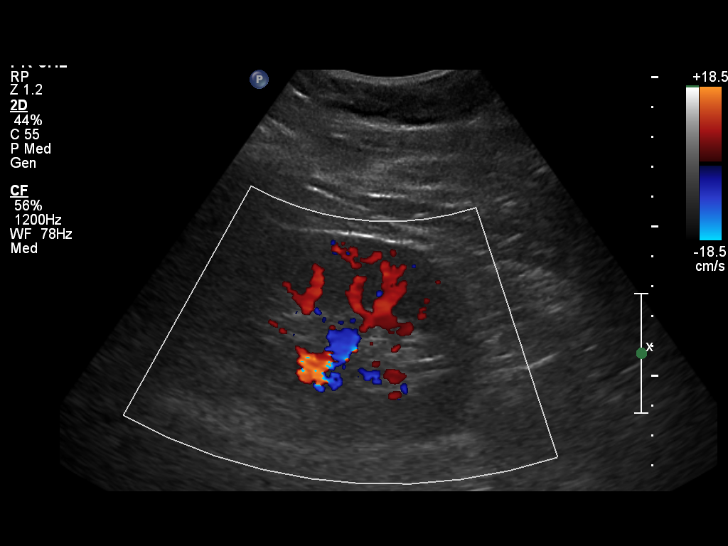
[im 74/74]
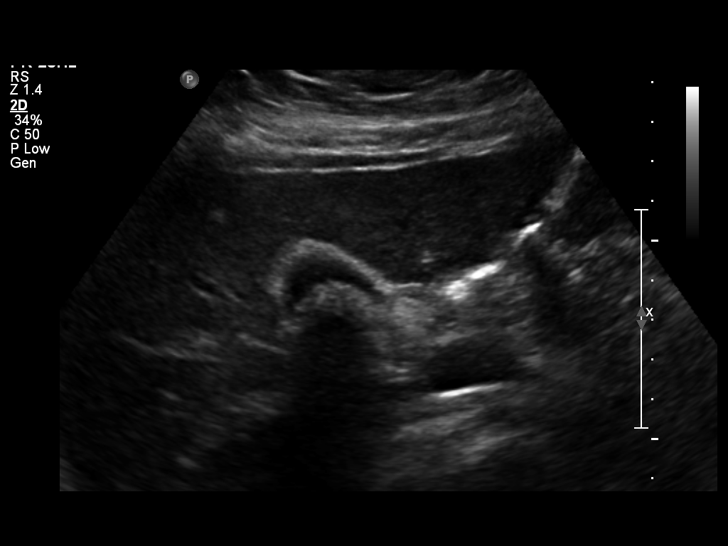

[13 of 25 positions shown; findings below may reference images not displayed]

FINDINGS: Gallbladder:  There is a large gallstone identified located in the
region of the gallbladder neck measuring 4.4 cm in length.  This
does not demonstrate movement with changes in patient position.  No
evidence for gallbladder wall thickening, pericholecystic fluid or
a sonographic Murphy's sign were present to suggest associated
cholecystitis.

Common bile duct:  Measures 2.7 mm in width and has a normal
appearance

Liver:  No focal lesion identified.  Within normal limits in
parenchymal echogenicity.

IVC:  Appears normal.

Pancreas:  No focal abnormality seen.

Spleen:  Demonstrates a sagittal length of 3.9 cm.  No focal
parenchymal abnormality is seen.

Right Kidney:  Has a sagittal length of 10.7 cm.  No focal
parenchymal abnormality or signs of hydronephrosis are seen

Left Kidney:  Has a sagittal length of 10.7 cm.  No focal
parenchymal abnormality or signs of hydronephrosis are seen

Abdominal aorta:  Has a maximal caliber 1.9 cm with no evidence for
aneurysmal dilatation.
IMPRESSION: Large solitary gallstone located in the region of the gallbladder
neck and not demonstrating movement with changes in position.  No
ancillary sonographic features suggesting associated cholecystitis.

Otherwise unremarkable abdominal ultrasound.

This report was called to Govind in Dr. [REDACTED]. The
patient was advised per his request to go the the ER if pain
escalates.

## 2012-02-18 ENCOUNTER — Encounter: Payer: Self-pay | Admitting: Obstetrics & Gynecology

## 2012-04-03 ENCOUNTER — Emergency Department (HOSPITAL_COMMUNITY)
Admission: EM | Admit: 2012-04-03 | Discharge: 2012-04-03 | Disposition: A | Discharge status: Home / Self Care | Payer: 59 | Attending: Emergency Medicine | Admitting: Emergency Medicine

## 2012-04-03 ENCOUNTER — Emergency Department (HOSPITAL_COMMUNITY): Payer: 59

## 2012-04-03 ENCOUNTER — Encounter (HOSPITAL_COMMUNITY): Payer: Self-pay | Admitting: Family Medicine

## 2012-04-03 DIAGNOSIS — Z79899 Other long term (current) drug therapy: Secondary | ICD-10-CM | POA: Insufficient documentation

## 2012-04-03 DIAGNOSIS — M25579 Pain in unspecified ankle and joints of unspecified foot: Secondary | ICD-10-CM | POA: Insufficient documentation

## 2012-04-03 DIAGNOSIS — F17200 Nicotine dependence, unspecified, uncomplicated: Secondary | ICD-10-CM | POA: Insufficient documentation

## 2012-04-03 DIAGNOSIS — J45909 Unspecified asthma, uncomplicated: Secondary | ICD-10-CM | POA: Insufficient documentation

## 2012-04-03 MED ORDER — HYDROCODONE-ACETAMINOPHEN 5-325 MG PO TABS: 2.0000 | ORAL_TABLET | Freq: Four times a day (QID) | ORAL | Status: DC | PRN

## 2012-04-03 MED ORDER — HYDROCODONE-ACETAMINOPHEN 5-325 MG PO TABS
2.0000 | ORAL_TABLET | Freq: Once | ORAL | Status: AC
  Administered 2012-04-03: 2 via ORAL
  Filled 2012-04-03: qty 2

## 2012-04-03 NOTE — ED Notes (Signed)
MD at bedside. 

## 2012-04-03 NOTE — ED Notes (Signed)
Patient transported to X-ray 

## 2012-04-03 NOTE — ED Notes (Signed)
Patient states that she has had ankle pain for a week but the pain got bad Saturday and has been getting worse. Denies injury. Reports pain with weight bearing; pain shoots up her leg.

## 2012-04-03 NOTE — ED Provider Notes (Signed)
History     CSN: [id number]  Arrival date & time 04/03/12  8295   First MD Initiated Contact with Patient 04/03/12 0700      Chief Complaint  Patient presents with  . Ankle Pain    (Consider location/radiation/quality/duration/timing/severity/associated sxs/prior treatment) HPI Comments: Patient presents with one week history of right ankle pain.  She denies any injury or trauma.  The pain has worsened the past two days and is having difficulty walking due to this.    Patient is a 37 y.o. female presenting with ankle pain. The history is provided by the patient.  Ankle Pain  Incident onset: pain started one weekago. There was no injury mechanism. The pain is present in the right ankle. The pain is moderate. The pain has been constant since onset. Associated symptoms include inability to bear weight. She reports no foreign bodies present. She has tried nothing for the symptoms.    Past Medical History  Diagnosis Date  . Asthma     Past Surgical History  Procedure Date  . Cesarean section   . Corneal transplant   . Cholecystectomy     No family history on file.  History  Substance Use Topics  . Smoking status: Current Every Day Smoker -- 0.5 packs/day    Types: Cigarettes  . Smokeless tobacco: Not on file  . Alcohol Use: Yes     Comment: socially    OB History    Grav Para Term Preterm Abortions TAB SAB Ect Mult Living   1 1        1       Review of Systems  All other systems reviewed and are negative.    Allergies  Shellfish allergy and Aspirin  Home Medications   Current Outpatient Rx  Name  Route  Sig  Dispense  Refill  . HYDROCODONE-ACETAMINOPHEN 5-325 MG PO TABS   Oral   Take 1 tablet by mouth daily as needed.         Suzzanne Cloud ESTRADIOL 0.25-35 MG-MCG PO TABS   Oral   Take 1 tablet by mouth daily.   1 Package   11   . OXYCODONE-ACETAMINOPHEN 5-325 MG PO TABS   Oral   Take 1 tablet by mouth daily as needed. For pain         .  OXYCODONE-ACETAMINOPHEN 5-325 MG PO TABS   Oral   Take 1 tablet by mouth every 4 (four) hours as needed for pain.   20 tablet   0   . PREDNISOLONE ACETATE 1 % OP SUSP   Both Eyes   Place 1 drop into both eyes daily.            BP 125/77  Pulse 78  Temp 98.7 F (37.1 C) (Oral)  Resp 20  SpO2 98%  LMP 03/30/2012  Physical Exam  Nursing note and vitals reviewed. Constitutional: She is oriented to person, place, and time. She appears well-developed and well-nourished. No distress.  HENT:  Head: Normocephalic and atraumatic.  Neck: Normal range of motion. Neck supple.  Musculoskeletal: Normal range of motion.       There is ttp to the medial aspect of the right ankle.  There is no swelling, ecchymosis, or deformity.  The pulses are intact.  There is no calf swelling, ttp, and Homans sign is absent.  Neurological: She is alert and oriented to person, place, and time.  Skin: Skin is warm and dry. She is not diaphoretic.    ED Course  Procedures (including critical care time)  Labs Reviewed - No data to display No results found.   No diagnosis found.    MDM  The ankle has been bothering her for a week in the absence of any injury or trauma.  It appears grossly normal and is stable to exam.  Doubt septic arthritis, gout.  Will treat with nsaids, ace, rest, time.  To follow up with ortho if not improving in the next week.          Geoffery Lyons, MD 04/03/12 [phone]

## 2012-04-06 ENCOUNTER — Other Ambulatory Visit (HOSPITAL_COMMUNITY): Payer: Self-pay | Admitting: Sports Medicine

## 2012-04-06 DIAGNOSIS — R52 Pain, unspecified: Secondary | ICD-10-CM

## 2012-04-10 ENCOUNTER — Ambulatory Visit (HOSPITAL_COMMUNITY): Admission: RE | Admit: 2012-04-10 | Payer: 59 | Source: Ambulatory Visit

## 2012-04-21 ENCOUNTER — Emergency Department (HOSPITAL_COMMUNITY)
Admission: EM | Admit: 2012-04-21 | Discharge: 2012-04-21 | Disposition: A | Discharge status: Home / Self Care | Payer: PRIVATE HEALTH INSURANCE | Attending: Emergency Medicine | Admitting: Emergency Medicine

## 2012-04-21 ENCOUNTER — Emergency Department (HOSPITAL_COMMUNITY): Payer: PRIVATE HEALTH INSURANCE

## 2012-04-21 ENCOUNTER — Encounter (HOSPITAL_COMMUNITY): Payer: Self-pay | Admitting: Nurse Practitioner

## 2012-04-21 DIAGNOSIS — J45909 Unspecified asthma, uncomplicated: Secondary | ICD-10-CM | POA: Insufficient documentation

## 2012-04-21 DIAGNOSIS — Y9301 Activity, walking, marching and hiking: Secondary | ICD-10-CM | POA: Insufficient documentation

## 2012-04-21 DIAGNOSIS — W010XXA Fall on same level from slipping, tripping and stumbling without subsequent striking against object, initial encounter: Secondary | ICD-10-CM | POA: Insufficient documentation

## 2012-04-21 DIAGNOSIS — IMO0002 Reserved for concepts with insufficient information to code with codable children: Secondary | ICD-10-CM | POA: Insufficient documentation

## 2012-04-21 DIAGNOSIS — R209 Unspecified disturbances of skin sensation: Secondary | ICD-10-CM | POA: Insufficient documentation

## 2012-04-21 DIAGNOSIS — S8392XA Sprain of unspecified site of left knee, initial encounter: Secondary | ICD-10-CM

## 2012-04-21 DIAGNOSIS — F17200 Nicotine dependence, unspecified, uncomplicated: Secondary | ICD-10-CM | POA: Insufficient documentation

## 2012-04-21 DIAGNOSIS — Y9289 Other specified places as the place of occurrence of the external cause: Secondary | ICD-10-CM | POA: Insufficient documentation

## 2012-04-21 MED ORDER — HYDROCODONE-ACETAMINOPHEN 5-325 MG PO TABS
1.0000 | ORAL_TABLET | Freq: Once | ORAL | Status: AC
  Administered 2012-04-21: 1 via ORAL
  Filled 2012-04-21: qty 1

## 2012-04-21 MED ORDER — HYDROCODONE-ACETAMINOPHEN 5-325 MG PO TABS: 1.0000 | ORAL_TABLET | ORAL | Status: DC | PRN

## 2012-04-21 NOTE — ED Provider Notes (Signed)
History  This chart was scribed for Loren Racer, MD by Bennett Scrape, ED Scribe. This patient was seen in room WTR5/WTR5 and the patient's care was started at 9:00 AM.  CSN: [id number]  Arrival date & time 04/21/12  0836   First MD Initiated Contact with Patient 04/21/12 0900      Chief Complaint  Patient presents with  . Knee Injury    Fall    Patient is a 37 y.o. female presenting with fall. The history is provided by the patient. No language interpreter was used.  Fall The accident occurred less than 1 hour ago. The fall occurred while walking. She landed on concrete. There was no blood loss. The point of impact was the left knee. The pain is present in the left knee. She was ambulatory at the scene. There was no entrapment after the fall. There was no drug use involved in the accident. There was no alcohol use involved in the accident. Pertinent negatives include no headaches. The symptoms are aggravated by use of the injured limb. She has tried nothing for the symptoms.    Julie Munoz is a 37 y.o. female who presents to the Emergency Department complaining of sudden onset, non-changing, constant left knee pain described as aching after slip and fall in the ED parking lot while the pt was walking in to work. She reports that her left leg turned out laterally and her upper body fell towards the right. She states that she was ambulatory after the event. She denies head trauma or neck pain as associated symptoms. She reports that she has been having recurrent knee and back pain from being on her feet at work. She has a h/o asthma and is a current everyday smoker and occasional alcohol user.  Past Medical History  Diagnosis Date  . Asthma     Past Surgical History  Procedure Laterality Date  . Cesarean section    . Corneal transplant    . Cholecystectomy      Family History  Problem Relation Age of Onset  . Asthma Mother     History  Substance Use Topics  .  Smoking status: Current Every Day Smoker -- 0.50 packs/day    Types: Cigarettes  . Smokeless tobacco: Not on file  . Alcohol Use: Yes     Comment: socially    OB History   Grav Para Term Preterm Abortions TAB SAB Ect Mult Living   1 1        1       Review of Systems  HENT: Negative for neck pain.   Musculoskeletal: Negative for back pain.       Positive for left knee pain  Neurological: Negative for headaches.  All other systems reviewed and are negative.    Allergies  Shellfish allergy and Aspirin  Home Medications   Current Outpatient Rx  Name  Route  Sig  Dispense  Refill  . HYDROcodone-acetaminophen (NORCO/VICODIN) 5-325 MG per tablet   Oral   Take 2 tablets by mouth every 6 (six) hours as needed for pain.   12 tablet   0   . prednisoLONE acetate (PRED FORTE) 1 % ophthalmic suspension   Both Eyes   Place 1 drop into both eyes daily.          Marland Kitchen HYDROcodone-acetaminophen (NORCO/VICODIN) 5-325 MG per tablet   Oral   Take 1 tablet by mouth every 4 (four) hours as needed for pain.   15 tablet   0  Triage Vitals: BP 121/59  Temp(Src) 98.5 F (36.9 C) (Oral)  Resp 28  SpO2 98%  LMP 03/30/2012  Physical Exam  Nursing note and vitals reviewed. Constitutional: She is oriented to person, place, and time. She appears well-developed and well-nourished. No distress.  HENT:  Head: Normocephalic and atraumatic.  No obvious head trauma   Eyes: EOM are normal.  Neck: Neck supple. No tracheal deviation present.  No posterior cervical tenderness  Cardiovascular: Normal rate.   Pulmonary/Chest: Effort normal. No respiratory distress.  Musculoskeletal: Normal range of motion.  neurovascularly intact, bilateral tibial plateau tenderness, no ligamentous instability, no deformities  Neurological: She is alert and oriented to person, place, and time.  Skin: Skin is warm and dry.  Psychiatric: She has a normal mood and affect. Her behavior is normal.    ED Course   Procedures (including critical care time)  DIAGNOSTIC STUDIES: Oxygen Saturation is 98% on room air, normal by my interpretation.    COORDINATION OF CARE: 9:38 AM- Advised pt of radiology results. Discussed discharge plan which includes ice, antiinflammatories, elevation, brace and crutches with pt and pt agreed to plan. Also advised pt to take antiinflammatories with food and to follow up with orthopedist. Pt refused crutches but is agreeable to this plan.  Labs Reviewed - No data to display Dg Ankle Complete Left  04/21/2012  *RADIOLOGY REPORT*  Clinical Data: Injury  LEFT ANKLE COMPLETE - 3+ VIEW  Comparison: None.  Findings: Three views of the left ankle submitted.  No acute fracture or subluxation.  Ankle mortise is preserved.  IMPRESSION: No acute fracture or subluxation.   Original Report Authenticated By: Natasha Mead, M.D.    Dg Knee Complete 4 Views Left  04/21/2012  *RADIOLOGY REPORT*  Clinical Data: Knee injury  LEFT KNEE - COMPLETE 4+ VIEW  Comparison: None.  Findings: Five views of the left knee submitted.  No acute fracture or subluxation.  No joint effusion.  Mild spurring of the patella.  IMPRESSION: No acute fracture or subluxation.  Mild spurring of patella.   Original Report Authenticated By: Natasha Mead, M.D.      1. Knee sprain, left, initial encounter       MDM  I personally performed the services described in this documentation, which was scribed in my presence. The recorded information has been reviewed and is accurate.       Loren Racer, MD 04/21/12 [phone]

## 2012-04-21 NOTE — ED Notes (Signed)
Bed:WA05<BR> Expected date:<BR> Expected time:<BR> Means of arrival:<BR> Comments:<BR>

## 2012-04-21 NOTE — ED Notes (Signed)
Per patient: Pt was walking up to the gate and fell just before entering the gate by the loading dock.  Left leg turned out laterally and upper body fell towards the right; first point of impact was the medial aspect of left knee.  Pt reports throbbing, aching pain in left thigh all the way down to left ankle.  The left ankle feels numb and toes are tingling.

## 2012-05-25 ENCOUNTER — Encounter (HOSPITAL_COMMUNITY): Payer: Self-pay | Admitting: *Deleted

## 2012-05-25 ENCOUNTER — Inpatient Hospital Stay (HOSPITAL_COMMUNITY)
Admission: AD | Admit: 2012-05-25 | Discharge: 2012-05-25 | Disposition: A | Discharge status: Home / Self Care | Payer: Managed Care, Other (non HMO) | Source: Ambulatory Visit | Attending: Obstetrics and Gynecology | Admitting: Obstetrics and Gynecology

## 2012-05-25 DIAGNOSIS — D250 Submucous leiomyoma of uterus: Secondary | ICD-10-CM

## 2012-05-25 DIAGNOSIS — N949 Unspecified condition associated with female genital organs and menstrual cycle: Secondary | ICD-10-CM | POA: Insufficient documentation

## 2012-05-25 DIAGNOSIS — R1031 Right lower quadrant pain: Secondary | ICD-10-CM | POA: Insufficient documentation

## 2012-05-25 DIAGNOSIS — N938 Other specified abnormal uterine and vaginal bleeding: Secondary | ICD-10-CM | POA: Insufficient documentation

## 2012-05-25 DIAGNOSIS — D259 Leiomyoma of uterus, unspecified: Secondary | ICD-10-CM | POA: Insufficient documentation

## 2012-05-25 DIAGNOSIS — D649 Anemia, unspecified: Secondary | ICD-10-CM | POA: Insufficient documentation

## 2012-05-25 LAB — CBC
MCH: 28.8 pg (ref 26.0–34.0)
Platelets: 246 10*3/uL (ref 150–400)
RBC: 3.72 MIL/uL — ABNORMAL LOW (ref 3.87–5.11)
WBC: 9 10*3/uL (ref 4.0–10.5)

## 2012-05-25 MED ORDER — KETOROLAC TROMETHAMINE 60 MG/2ML IM SOLN
60.0000 mg | Freq: Once | INTRAMUSCULAR | Status: AC
  Administered 2012-05-25: 60 mg via INTRAMUSCULAR
  Filled 2012-05-25: qty 2

## 2012-05-25 MED ORDER — FERROUS SULFATE 325 (65 FE) MG PO TABS: 325.0000 mg | ORAL_TABLET | Freq: Every day | ORAL | Status: DC

## 2012-05-25 MED ORDER — OXYCODONE-ACETAMINOPHEN 5-325 MG PO TABS: 2.0000 | ORAL_TABLET | ORAL | Status: DC | PRN

## 2012-05-25 NOTE — MAU Provider Note (Signed)
History     CSN: [id number]  Arrival date and time: 05/25/12 1711   First Provider Initiated Contact with Patient 05/25/12 1812      No chief complaint on file.  HPI Julie Munoz is a 37 y.o. G1P1 who presents to MAU today with vaginal bleeding. The patient states a history of uterine fibroids previously diagnosed 2-3 months ago. The patient states LMP was 05/12/12 and ended on 05/20/12. Sunday she passed a large clot the size of her hand and then bled again like a regular period Monday and Tuesday of this week. The bleeding was very light yesterday until this afternoon. Around 4 pm the patient started bleeding heavily again and bled through her tampon and pad and filled the toilet with blood. She has only a small amount of blood on her pad upon arrival. She is dizzy and lightheaded on occasion, but denies LOC. She has headache and abdominal and lower back pain. Abdominal pain is RLQ and rated at 8/10 and low back pain is rated at 10/10 now. She denies a history of irregular periods although she does state that they are sometimes heavy and sometimes very light. She saw Dr. Jackelyn Knife after the initial diagnosis of fibroids and was told that she could have a hysterectomy or ablation per patient. She has not followed-up since then since she hasn't decided how she would like to proceed.   OB History   Grav Para Term Preterm Abortions TAB SAB Ect Mult Living   1 1        1       Past Medical History  Diagnosis Date  . Asthma     Past Surgical History  Procedure Laterality Date  . Cesarean section    . Corneal transplant    . Cholecystectomy      Family History  Problem Relation Age of Onset  . Asthma Mother     History  Substance Use Topics  . Smoking status: Current Every Day Smoker -- 0.50 packs/day    Types: Cigarettes  . Smokeless tobacco: Never Used  . Alcohol Use: Yes     Comment: socially    Allergies:  Allergies  Allergen Reactions  . Shellfish Allergy  Anaphylaxis  . Aspirin Other (See Comments)    Allergy unknown    Prescriptions prior to admission  Medication Sig Dispense Refill  . prednisoLONE acetate (PRED FORTE) 1 % ophthalmic suspension Place 1 drop into both eyes daily.         Review of Systems  Constitutional: Negative for fever and malaise/fatigue.  Gastrointestinal: Positive for abdominal pain. Negative for nausea, vomiting, diarrhea and constipation.  Genitourinary: Negative for dysuria, urgency and frequency.  Musculoskeletal: Positive for back pain.  Neurological: Positive for dizziness and headaches. Negative for loss of consciousness.   Physical Exam   Blood pressure 131/76, pulse 83, temperature 98.3 F (36.8 C), temperature source Oral, resp. rate 20, height 5' 4.5" (1.638 m), weight 276 lb (125.193 kg), last menstrual period 05/12/2012.  Physical Exam  Constitutional: She appears well-developed and well-nourished. No distress.  HENT:  Head: Normocephalic and atraumatic.  Cardiovascular: Normal rate, regular rhythm and normal heart sounds.   Respiratory: Effort normal and breath sounds normal. No respiratory distress.  GI: Soft. Bowel sounds are normal. She exhibits no distension and no mass. There is tenderness. There is no rebound and no guarding.  Genitourinary: Vagina normal. Uterus is enlarged. Uterus is not tender. Cervix exhibits discharge (small clot noted at the cervical  os. minimal bleeding in the vaginal vault). Cervix exhibits no motion tenderness and no friability. Right adnexum displays tenderness. Right adnexum displays no mass. Left adnexum displays no mass and no tenderness.  Neurological: She is alert.  Skin: Skin is warm and dry. No erythema.  Psychiatric: She has a normal mood and affect.   Results for orders placed during the hospital encounter of 05/25/12 (from the past 24 hour(s))  CBC     Status: Abnormal   Collection Time    05/25/12  6:40 PM      Result Value Range   WBC 9.0  4.0 -  10.5 K/uL   RBC 3.72 (*) 3.87 - 5.11 MIL/uL   Hemoglobin 10.7 (*) 12.0 - 15.0 g/dL   HCT 45.4 (*) 09.8 - 11.9 %   MCV 87.1  78.0 - 100.0 fL   MCH 28.8  26.0 - 34.0 pg   MCHC 33.0  30.0 - 36.0 g/dL   RDW 14.7 (*) 82.9 - 56.2 %   Platelets 246  150 - 400 K/uL     MAU Course  Procedures None  MDM CBC today Discussed patient with Dr. Ellyn Hack. She would like [place] to do the CBC to ensure patient is stable. PO iron may be necessary. Patient should follow-up in the office soon  Assessment and Plan  A: Uterine fibroids DUB Anemia  P: Discharge home Rx for percocet and iron given/sent to patient's pharmacy Bleeding precautions discussed Patient encouraged to call [facility] to make an appointment for follow-up ASAP Patient may return to MAU if her condition were to change or worsen  Freddi Starr, PA-C  05/25/2012, 7:18 PM

## 2012-05-25 NOTE — MAU Note (Signed)
Period went off Sat. Spotting sun, Mon, Tue.  Past fist sized clot on Tues.  Today - something just broke.  Bled through tampon, pad and filled the toilet. (~ 1645-1700)

## 2012-07-26 ENCOUNTER — Encounter (HOSPITAL_COMMUNITY): Payer: Self-pay | Admitting: Pharmacist

## 2012-08-01 ENCOUNTER — Encounter (HOSPITAL_COMMUNITY)
Admission: RE | Admit: 2012-08-01 | Discharge: 2012-08-01 | Disposition: A | Discharge status: Home / Self Care | Payer: Managed Care, Other (non HMO) | Source: Ambulatory Visit | Attending: Obstetrics and Gynecology | Admitting: Obstetrics and Gynecology

## 2012-08-01 ENCOUNTER — Encounter (HOSPITAL_COMMUNITY): Payer: Self-pay

## 2012-08-01 HISTORY — DX: Other allergy status, other than to drugs and biological substances: Z91.09

## 2012-08-01 LAB — CBC
MCH: 28.5 pg (ref 26.0–34.0)
Platelets: 298 10*3/uL (ref 150–400)
RBC: 4.18 MIL/uL (ref 3.87–5.11)
WBC: 7.2 10*3/uL (ref 4.0–10.5)

## 2012-08-01 LAB — SURGICAL PCR SCREEN
MRSA, PCR: NEGATIVE
Staphylococcus aureus: NEGATIVE

## 2012-08-01 NOTE — Patient Instructions (Addendum)
   Your procedure is scheduled on: Tuesday, June 3  Enter through the [employer] [facility] at: 930 am Pick up the phone at the desk and dial [phone] and inform us of your arrival.  Please call this number if you have any problems the morning of surgery: [phone]  Remember: Do not eat or drink after midnight: Monday Take these medicines the morning of surgery with a SIP OF WATER:  None  Do not wear jewelry, make-up, or FINGER nail polish No metal in your hair or on your body. Do not wear lotions, powders, perfumes. You may wear deodorant.  Please use your CHG wash as directed prior to surgery.  Do not shave anywhere for at least 12 hours prior to first CHG shower.  Do not bring valuables to the hospital. Contacts, dentures or bridgework may not be worn into surgery.  Patients discharged on the day of surgery will not be allowed to drive home.  Home with - Ride to be arranged prior to surgery.

## 2012-08-08 ENCOUNTER — Encounter (HOSPITAL_COMMUNITY): Payer: Self-pay | Admitting: *Deleted

## 2012-08-08 ENCOUNTER — Ambulatory Visit (HOSPITAL_COMMUNITY)
Admission: RE | Admit: 2012-08-08 | Discharge: 2012-08-08 | Disposition: A | Discharge status: Home / Self Care | Payer: Managed Care, Other (non HMO) | Source: Ambulatory Visit | Attending: Obstetrics and Gynecology | Admitting: Obstetrics and Gynecology

## 2012-08-08 ENCOUNTER — Ambulatory Visit (HOSPITAL_COMMUNITY): Payer: Managed Care, Other (non HMO) | Admitting: Anesthesiology

## 2012-08-08 ENCOUNTER — Encounter (HOSPITAL_COMMUNITY)
Admission: RE | Disposition: A | Discharge status: Home / Self Care | Payer: Self-pay | Source: Ambulatory Visit | Attending: Obstetrics and Gynecology

## 2012-08-08 ENCOUNTER — Encounter (HOSPITAL_COMMUNITY): Payer: Self-pay | Admitting: Anesthesiology

## 2012-08-08 DIAGNOSIS — N938 Other specified abnormal uterine and vaginal bleeding: Secondary | ICD-10-CM | POA: Insufficient documentation

## 2012-08-08 DIAGNOSIS — D250 Submucous leiomyoma of uterus: Secondary | ICD-10-CM | POA: Insufficient documentation

## 2012-08-08 DIAGNOSIS — N949 Unspecified condition associated with female genital organs and menstrual cycle: Secondary | ICD-10-CM | POA: Insufficient documentation

## 2012-08-08 DIAGNOSIS — R102 Pelvic and perineal pain: Secondary | ICD-10-CM

## 2012-08-08 HISTORY — PX: LAPAROSCOPY: SHX197

## 2012-08-08 HISTORY — DX: Pelvic and perineal pain: R10.2

## 2012-08-08 HISTORY — DX: Submucous leiomyoma of uterus: D25.0

## 2012-08-08 SURGERY — LAPAROSCOPY OPERATIVE
Anesthesia: General | Site: Abdomen | Wound class: Clean Contaminated

## 2012-08-08 MED ORDER — BUPIVACAINE HCL (PF) 0.25 % IJ SOLN
INTRAMUSCULAR | Status: AC
  Filled 2012-08-08: qty 30

## 2012-08-08 MED ORDER — ROCURONIUM BROMIDE 50 MG/5ML IV SOLN
INTRAVENOUS | Status: AC
  Filled 2012-08-08: qty 1

## 2012-08-08 MED ORDER — LIDOCAINE HCL 2 % IJ SOLN
INTRAMUSCULAR | Status: AC
  Filled 2012-08-08: qty 20

## 2012-08-08 MED ORDER — GLYCOPYRROLATE 0.2 MG/ML IJ SOLN
INTRAMUSCULAR | Status: AC
  Filled 2012-08-08: qty 3

## 2012-08-08 MED ORDER — GLYCOPYRROLATE 0.2 MG/ML IJ SOLN
INTRAMUSCULAR | Status: AC
  Filled 2012-08-08: qty 1

## 2012-08-08 MED ORDER — ONDANSETRON HCL 4 MG/2ML IJ SOLN
INTRAMUSCULAR | Status: AC
  Filled 2012-08-08: qty 2

## 2012-08-08 MED ORDER — DEXAMETHASONE SODIUM PHOSPHATE 10 MG/ML IJ SOLN
INTRAMUSCULAR | Status: DC | PRN
  Administered 2012-08-08: 10 mg via INTRAVENOUS

## 2012-08-08 MED ORDER — OXYCODONE-ACETAMINOPHEN 5-325 MG PO TABS
ORAL_TABLET | ORAL | Status: AC
  Filled 2012-08-08: qty 1

## 2012-08-08 MED ORDER — LIDOCAINE HCL (CARDIAC) 20 MG/ML IV SOLN
INTRAVENOUS | Status: DC | PRN
  Administered 2012-08-08: 80 mg via INTRAVENOUS

## 2012-08-08 MED ORDER — LIDOCAINE HCL 2 % IJ SOLN
INTRAMUSCULAR | Status: DC | PRN
  Administered 2012-08-08: 16 mL

## 2012-08-08 MED ORDER — FENTANYL CITRATE 0.05 MG/ML IJ SOLN
INTRAMUSCULAR | Status: AC
  Administered 2012-08-08: 50 ug via INTRAVENOUS
  Filled 2012-08-08: qty 2

## 2012-08-08 MED ORDER — HYDROCODONE-ACETAMINOPHEN 5-325 MG PO TABS: 1.0000 | ORAL_TABLET | Freq: Four times a day (QID) | ORAL | Status: DC | PRN

## 2012-08-08 MED ORDER — LACTATED RINGERS IV SOLN
INTRAVENOUS | Status: DC
  Administered 2012-08-08: 11:00:00 via INTRAVENOUS

## 2012-08-08 MED ORDER — NEOSTIGMINE METHYLSULFATE 1 MG/ML IJ SOLN
INTRAMUSCULAR | Status: AC
  Filled 2012-08-08: qty 1

## 2012-08-08 MED ORDER — LIDOCAINE HCL (CARDIAC) 20 MG/ML IV SOLN
INTRAVENOUS | Status: AC
  Filled 2012-08-08: qty 5

## 2012-08-08 MED ORDER — MIDAZOLAM HCL 2 MG/2ML IJ SOLN
INTRAMUSCULAR | Status: AC
  Filled 2012-08-08: qty 2

## 2012-08-08 MED ORDER — GLYCOPYRROLATE 0.2 MG/ML IJ SOLN
INTRAMUSCULAR | Status: DC | PRN
  Administered 2012-08-08: 0.6 mg via INTRAVENOUS
  Administered 2012-08-08: 0.2 mg via INTRAVENOUS

## 2012-08-08 MED ORDER — PROPOFOL 10 MG/ML IV EMUL
INTRAVENOUS | Status: AC
  Filled 2012-08-08: qty 20

## 2012-08-08 MED ORDER — ACETAMINOPHEN 160 MG/5ML PO SOLN: 975.0000 mg | Freq: Once | ORAL | Status: AC

## 2012-08-08 MED ORDER — FENTANYL CITRATE 0.05 MG/ML IJ SOLN
INTRAMUSCULAR | Status: DC | PRN
Start: 2012-08-08 — End: 2012-08-08
  Administered 2012-08-08 (×2): 100 ug via INTRAVENOUS

## 2012-08-08 MED ORDER — KETOROLAC TROMETHAMINE 30 MG/ML IJ SOLN
INTRAMUSCULAR | Status: AC
  Filled 2012-08-08: qty 1

## 2012-08-08 MED ORDER — LACTATED RINGERS IV SOLN
INTRAVENOUS | Status: DC
Start: 2012-08-08 — End: 2012-08-08
  Administered 2012-08-08 (×2): via INTRAVENOUS

## 2012-08-08 MED ORDER — DEXAMETHASONE SODIUM PHOSPHATE 10 MG/ML IJ SOLN
INTRAMUSCULAR | Status: AC
  Filled 2012-08-08: qty 1

## 2012-08-08 MED ORDER — KETOROLAC TROMETHAMINE 30 MG/ML IJ SOLN: 15.0000 mg | Freq: Once | INTRAMUSCULAR | Status: DC | PRN

## 2012-08-08 MED ORDER — ROCURONIUM BROMIDE 100 MG/10ML IV SOLN
INTRAVENOUS | Status: DC | PRN
  Administered 2012-08-08: 50 mg via INTRAVENOUS

## 2012-08-08 MED ORDER — ALBUTEROL SULFATE HFA 108 (90 BASE) MCG/ACT IN AERS
INHALATION_SPRAY | RESPIRATORY_TRACT | Status: DC | PRN
  Administered 2012-08-08: 3 via RESPIRATORY_TRACT

## 2012-08-08 MED ORDER — OXYCODONE-ACETAMINOPHEN 5-325 MG PO TABS
1.0000 | ORAL_TABLET | Freq: Once | ORAL | Status: AC
  Administered 2012-08-08: 1 via ORAL

## 2012-08-08 MED ORDER — ONDANSETRON HCL 4 MG/2ML IJ SOLN
INTRAMUSCULAR | Status: DC | PRN
  Administered 2012-08-08: 4 mg via INTRAVENOUS

## 2012-08-08 MED ORDER — MIDAZOLAM HCL 5 MG/5ML IJ SOLN
INTRAMUSCULAR | Status: DC | PRN
  Administered 2012-08-08: 2 mg via INTRAVENOUS

## 2012-08-08 MED ORDER — ACETAMINOPHEN 160 MG/5ML PO SOLN
ORAL | Status: AC
  Administered 2012-08-08: 975 mg via ORAL
  Filled 2012-08-08: qty 40.6

## 2012-08-08 MED ORDER — FENTANYL CITRATE 0.05 MG/ML IJ SOLN
INTRAMUSCULAR | Status: AC
  Filled 2012-08-08: qty 2

## 2012-08-08 MED ORDER — NEOSTIGMINE METHYLSULFATE 1 MG/ML IJ SOLN
INTRAMUSCULAR | Status: DC | PRN
  Administered 2012-08-08: 1 mg via INTRAVENOUS
  Administered 2012-08-08: 4 mg via INTRAVENOUS

## 2012-08-08 MED ORDER — BUPIVACAINE HCL (PF) 0.25 % IJ SOLN
INTRAMUSCULAR | Status: DC | PRN
  Administered 2012-08-08: 6 mL

## 2012-08-08 MED ORDER — SODIUM CHLORIDE 0.9 % IR SOLN
Status: DC | PRN
  Administered 2012-08-08: 3000 mL
  Administered 2012-08-08: 1

## 2012-08-08 MED ORDER — FENTANYL CITRATE 0.05 MG/ML IJ SOLN
INTRAMUSCULAR | Status: AC
  Filled 2012-08-08: qty 5

## 2012-08-08 MED ORDER — PROPOFOL 10 MG/ML IV BOLUS
INTRAVENOUS | Status: DC | PRN
  Administered 2012-08-08: 200 mg via INTRAVENOUS
  Administered 2012-08-08: 100 mg via INTRAVENOUS

## 2012-08-08 MED ORDER — FENTANYL CITRATE 0.05 MG/ML IJ SOLN: 25.0000 ug | INTRAMUSCULAR | Status: DC | PRN

## 2012-08-08 MED ORDER — INDIGOTINDISULFONATE SODIUM 8 MG/ML IJ SOLN
INTRAMUSCULAR | Status: AC
  Filled 2012-08-08: qty 5

## 2012-08-08 SURGICAL SUPPLY — 49 items
ABLATOR ENDOMETRIAL BIPOLAR (ABLATOR) IMPLANT
ADH SKN CLS APL DERMABOND .7 (GAUZE/BANDAGES/DRESSINGS) ×2
BAG SPEC RTRVL LRG 6X4 10 (ENDOMECHANICALS)
BLADE INCISOR TRUC PLUS 2.9 (ABLATOR) IMPLANT
CABLE HIGH FREQUENCY MONO STRZ (ELECTRODE) IMPLANT
CANISTERS HI-FLOW 3000CC (CANNISTER) ×6 IMPLANT
CATH ROBINSON RED A/P 16FR (CATHETERS) ×3 IMPLANT
CHLORAPREP W/TINT 26ML (MISCELLANEOUS) ×3 IMPLANT
CLOTH BEACON ORANGE TIMEOUT ST (SAFETY) ×3 IMPLANT
CONTAINER PREFILL 10% NBF 60ML (FORM) ×6 IMPLANT
DECANTER SPIKE VIAL GLASS SM (MISCELLANEOUS) ×3 IMPLANT
DERMABOND ADVANCED (GAUZE/BANDAGES/DRESSINGS) ×1
DERMABOND ADVANCED .7 DNX12 (GAUZE/BANDAGES/DRESSINGS) ×2 IMPLANT
DRAPE HYSTEROSCOPY (DRAPE) ×3 IMPLANT
DRESSING TELFA 8X3 (GAUZE/BANDAGES/DRESSINGS) ×3 IMPLANT
ELECT REM PT RETURN 9FT ADLT (ELECTROSURGICAL)
ELECTRODE REM PT RTRN 9FT ADLT (ELECTROSURGICAL) IMPLANT
GLOVE BIO SURGEON STRL SZ8 (GLOVE) ×3 IMPLANT
GLOVE ORTHO TXT STRL SZ7.5 (GLOVE) ×3 IMPLANT
GOWN PREVENTION PLUS LG XLONG (DISPOSABLE) ×3 IMPLANT
GOWN PREVENTION PLUS XXLARGE (GOWN DISPOSABLE) ×3 IMPLANT
GOWN STRL REIN XL XLG (GOWN DISPOSABLE) ×6 IMPLANT
INCISOR TRUC PLUS BLADE 2.9 (ABLATOR)
KIT HYSTEROSCOPY TRUCLEAR (ABLATOR) ×3 IMPLANT
MORCELLATOR RECIP TRUCLEAR 4.0 (ABLATOR) IMPLANT
NDL EPID 17G 5 ECHO TUOHY (NEEDLE) IMPLANT
NDL SPNL 22GX3.5 QUINCKE BK (NEEDLE) ×2 IMPLANT
NEEDLE EPID 17G 5 ECHO TUOHY (NEEDLE) IMPLANT
NEEDLE INSUFFLATION 120MM (ENDOMECHANICALS) ×3 IMPLANT
NEEDLE SPNL 22GX3.5 QUINCKE BK (NEEDLE) ×3 IMPLANT
NS IRRIG 1000ML POUR BTL (IV SOLUTION) ×3 IMPLANT
PACK LAPAROSCOPY BASIN (CUSTOM PROCEDURE TRAY) ×3 IMPLANT
PACK VAGINAL MINOR WOMEN LF (CUSTOM PROCEDURE TRAY) ×3 IMPLANT
PAD OB MATERNITY 4.3X12.25 (PERSONAL CARE ITEMS) ×3 IMPLANT
POUCH SPECIMEN RETRIEVAL 10MM (ENDOMECHANICALS) IMPLANT
PROTECTOR NERVE ULNAR (MISCELLANEOUS) ×3 IMPLANT
SCALPEL HARMONIC ACE (MISCELLANEOUS) IMPLANT
SET IRRIG TUBING LAPAROSCOPIC (IRRIGATION / IRRIGATOR) IMPLANT
SOLUTION ELECTROLUBE (MISCELLANEOUS) IMPLANT
SUT VICRYL 0 UR6 27IN ABS (SUTURE) IMPLANT
SUT VICRYL 4-0 PS2 18IN ABS (SUTURE) ×3 IMPLANT
SYR CONTROL 10ML LL (SYRINGE) ×3 IMPLANT
TOWEL OR 17X24 6PK STRL BLUE (TOWEL DISPOSABLE) ×6 IMPLANT
TRAY FOLEY CATH 14FR (SET/KITS/TRAYS/PACK) IMPLANT
TROCAR XCEL NON-BLD 11X100MML (ENDOMECHANICALS) IMPLANT
TROCAR XCEL NON-BLD 5MMX100MML (ENDOMECHANICALS) ×3 IMPLANT
TROCAR XCEL OPT SLVE 5M 100M (ENDOMECHANICALS) IMPLANT
WARMER LAPAROSCOPE (MISCELLANEOUS) ×3 IMPLANT
WATER STERILE IRR 1000ML POUR (IV SOLUTION) ×3 IMPLANT

## 2012-08-08 NOTE — Op Note (Signed)
Preoperative diagnosis: Pelvic pain, submucosal myoma Postoperative diagnosis: Pelvic pain, submucosal myoma Procedure: Diagnostic laparoscopy, hysteroscopy with resection of submucosal myoma with tru-clear Surgeon: Lavina Hamman M.D. Anesthesia: Gen. Endotracheal tube Findings: She had a normal upper abdomen normal pelvis. Tubes and ovaries were normal. Uterus was slightly enlarged and irregular with fibroids. There is no identifiable source of pelvic pain, no significant adhesions or evidence of endometriosis. With hysteroscopy she had a large submucosal myoma filling most of the cavity and this was resected completely as far as I could tell with tru-clear. Estimated blood loss: Minimal Fluid deficit: Fluid deficit deficit to the hysteroscope was about 1300 cc Complications: None Specimens: Resected myoma  Procedure in detail:  The patient was taken to the operating room and placed in the dorsosupine position. General anesthesia was induced. Legs were placed and mobile stirrups and her left arm was tucked to her side. Abdomen perineum and vagina were then prepped and draped in usual sterile fashion, bladder drained with a red Robinson catheter, Hulka tenaculum applied to the cervix for uterine manipulation. Infraumbilical skin was then infiltrated with quarter percent Marcaine and a 1 cm vertical incision was made. The varies needle was inserted in the peritoneal cavity and placement confirmed by the water drop test an opening pressure of 9 mm mercury. CO2 was insufflated to a pressure of 12 mm mercury and the varies needle was removed. A 5 mm trocar was then introduced with direct visualization with the laparoscope. A 5 mm port was then also placed on the left side under direct visualization. Inspection revealed the above-mentioned findings. No source for her pain was identified. The 5 mm ports were removed after gas was allowed to deflate from the abdomen. Skin incisions were closed with  interrupted subcuticular sutures of 4-0 Vicryl followed by Dermabond.  Attention was turned vaginally. The legs were elevated in stirrups. A Graves speculum was inserted in the vagina. The anterior lip of the cervix was grasped with a single-tooth tenaculum. A deep paracervical block was then performed with a total of 16 cc of 2% plain lidocaine. The cervix was then gradually easily dilated to a 27 dilator and then it easily accommodated the large obturator for the tru-clear device. The tru-clear device was appropriately prepared and inserted. Good visualization was achieved inside the uterus. A large submucosal myoma was seen to come from the anterior left portion of the uterus. Using the large tru-clear device this myoma was resected as completely as I could visualize. At the end of the procedure I cannot visualize any remaining fibroid. There were no other intrauterine lesions. The tru-clear device and hysteroscope removed. The single-tooth tenaculum was removed bleeding was controlled with pressure. All instruments were then removed from the vagina. The patient was taken to the recovery in stable condition after tolerating the procedure well. Counts were correct and she had PAS hose on throughout the procedure.

## 2012-08-08 NOTE — Transfer of Care (Signed)
Immediate Anesthesia Transfer of Care Note  Patient: Julie Munoz  Procedure(s) Performed: Procedure(s): LAPAROSCOPY OPERATIVE (N/A) DILATATION & CURETTAGE/HYSTEROSCOPY WITH TRUECLEAR (N/A)  Patient Location: PACU  Anesthesia Type:General  Level of Consciousness: awake, alert  and oriented  Airway & Oxygen Therapy: Patient Spontanous Breathing and Patient connected to nasal cannula oxygen  Post-op Assessment: Report given to PACU RN and Post -op Vital signs reviewed and stable  Post vital signs: Reviewed and stable  Complications: No apparent anesthesia complications

## 2012-08-08 NOTE — Anesthesia Postprocedure Evaluation (Signed)
  Anesthesia Post Note  Patient: Julie Munoz  Procedure(s) Performed: Procedure(s) (LRB): LAPAROSCOPY OPERATIVE (N/A) DILATATION & CURETTAGE/HYSTEROSCOPY WITH TRUECLEAR (N/A)  Anesthesia type: GA  Patient location: PACU  Post pain: Pain level controlled  Post assessment: Post-op Vital signs reviewed  Last Vitals:  Filed Vitals:   08/08/12 1415  BP: 129/74  Pulse: 57  Temp:   Resp: 22    Post vital signs: Reviewed  Level of consciousness: sedated  Complications: No apparent anesthesia complications

## 2012-08-08 NOTE — H&P (Signed)
Julie Munoz is an 37 y.o. female. She was seen late last year for abnormal bleeding and pelvic pain. Pelvic ultrasound at that time was normal except for a 3x4cm submucosal myoma.  She would now like to proceed with hysteroscopic removal of this myoma and laparoscopy to evaluate pelvic pain.  Pertinent Gynecological History: Last pap: normal Date: 2010 OB History: G1, P1001 c-section at 37 weeks for nonreassuring FHT   Menstrual History: No LMP recorded.    Past Medical History  Diagnosis Date  . Asthma     no inhaler -no problem as adult  . Environmental allergies     Past Surgical History  Procedure Laterality Date  . Cesarean section    . Corneal transplant      surgeries x 3, bilateral left x 2, right x 1  . Cholecystectomy      Family History  Problem Relation Age of Onset  . Asthma Mother     Social History:  reports that she has been smoking Cigarettes.  She has a 5 pack-year smoking history. She has never used smokeless tobacco. She reports that  drinks alcohol. She reports that she does not use illicit drugs.  Allergies:  Allergies  Allergen Reactions  . Shellfish Allergy Anaphylaxis  . Aspirin Other (See Comments)    Allergy unknown Pt can take ibuprofen without problems    No prescriptions prior to admission    Review of Systems  Respiratory: Negative.   Cardiovascular: Negative.   Gastrointestinal: Negative.   Genitourinary: Negative.     There were no vitals taken for this visit. Physical Exam  Constitutional: She appears well-developed and well-nourished.  Neck: Neck supple. No thyromegaly present.  Cardiovascular: Normal rate, regular rhythm and normal heart sounds.   No murmur heard. Respiratory: Effort normal and breath sounds normal. No respiratory distress.  GI: Soft. She exhibits no distension and no mass. There is no tenderness.  Transverse scar  Genitourinary: Vagina normal.  Uterus upper limits of normal size No adnexal  mass, mild bilateral adnexal tenderness    No results found for this or any previous visit (from the past 24 hour(s)).  No results found.  Assessment/Plan: AUB with submucosal myoma, pelvic pain.  All medical and surgical option shave been discussed.  Hysteroscopy with resection of myoma procedure, risks, chances of alleviating bleeding have been discussed.  Laparoscopy procedure, risks, chances of relieving pain have been discussed.  Will proceed with surgery as scheduled.    Ferd Horrigan D 08/08/2012, 8:33 AM

## 2012-08-08 NOTE — Interval H&P Note (Signed)
History and Physical Interval Note:  08/08/2012 11:02 AM  Julie Munoz  has presented today for surgery, with the diagnosis of pelvic pain,  submucosal myoma, [id number], [phone]  The various methods of treatment have been discussed with the patient and family. After consideration of risks, benefits and other options for treatment, the patient has consented to  Procedure(s): LAPAROSCOPY OPERATIVE (N/A) DILATATION & CURETTAGE/HYSTEROSCOPY WITH TRUECLEAR (N/A) as a surgical intervention .  The patient's history has been reviewed, patient examined, no change in status, stable for surgery.  I have reviewed the patient's chart and labs.  Questions were answered to the patient's satisfaction.     [place] D

## 2012-08-08 NOTE — Anesthesia Preprocedure Evaluation (Addendum)
Anesthesia Evaluation  Patient identified by MRN, date of birth, ID band Patient awake    Reviewed: Allergy & Precautions, H&P , NPO status , Patient's Chart, lab work & pertinent test results, reviewed documented beta blocker date and time   History of Anesthesia Complications Negative for: history of anesthetic complications  Airway Mallampati: I TM Distance: >3 FB Neck ROM: full    Dental  (+)    Pulmonary Current Smoker (3-4 cigs/day),  breath sounds clear to auscultation  Pulmonary exam normal       Cardiovascular Exercise Tolerance: Good negative cardio ROS  Rhythm:regular Rate:Normal     Neuro/Psych Back pain - relates to pelvic pain negative psych ROS   GI/Hepatic negative GI ROS, Neg liver ROS,   Endo/Other  Morbid obesity  Renal/GU negative Renal ROS  Female GU complaint (pelvic pain)     Musculoskeletal   Abdominal   Peds  Hematology negative hematology ROS (+)   Anesthesia Other Findings ASA "allergy" - childhood "bad experience" - reports she could take it now.  Able to take ibuprofen, aleve without problems  Reproductive/Obstetrics negative OB ROS                         Anesthesia Physical Anesthesia Plan  ASA: III  Anesthesia Plan: General ETT   Post-op Pain Management:    Induction:   Airway Management Planned:   Additional Equipment:   Intra-op Plan:   Post-operative Plan:   Informed Consent: I have reviewed the patients History and Physical, chart, labs and discussed the procedure including the risks, benefits and alternatives for the proposed anesthesia with the patient or authorized representative who has indicated his/her understanding and acceptance.   Dental Advisory Given  Plan Discussed with: CRNA and Surgeon  Anesthesia Plan Comments:         Anesthesia Quick Evaluation

## 2012-08-09 ENCOUNTER — Encounter (HOSPITAL_COMMUNITY): Payer: Self-pay | Admitting: Obstetrics and Gynecology

## 2013-01-09 ENCOUNTER — Encounter (HOSPITAL_COMMUNITY): Payer: Self-pay

## 2013-01-09 ENCOUNTER — Encounter (INDEPENDENT_AMBULATORY_CARE_PROVIDER_SITE_OTHER): Payer: Self-pay

## 2013-01-09 ENCOUNTER — Encounter (HOSPITAL_COMMUNITY)
Admission: RE | Admit: 2013-01-09 | Discharge: 2013-01-09 | Disposition: A | Discharge status: Home / Self Care | Payer: Managed Care, Other (non HMO) | Source: Ambulatory Visit | Attending: Obstetrics and Gynecology | Admitting: Obstetrics and Gynecology

## 2013-01-09 DIAGNOSIS — Z01812 Encounter for preprocedural laboratory examination: Secondary | ICD-10-CM | POA: Insufficient documentation

## 2013-01-09 DIAGNOSIS — Z01818 Encounter for other preprocedural examination: Secondary | ICD-10-CM | POA: Insufficient documentation

## 2013-01-09 LAB — CBC
Hemoglobin: 8.7 g/dL — ABNORMAL LOW (ref 12.0–15.0)
MCH: 23.9 pg — ABNORMAL LOW (ref 26.0–34.0)
MCHC: 31.1 g/dL (ref 30.0–36.0)
MCV: 76.9 fL — ABNORMAL LOW (ref 78.0–100.0)
RBC: 3.64 MIL/uL — ABNORMAL LOW (ref 3.87–5.11)
RDW: 17.7 % — ABNORMAL HIGH (ref 11.5–15.5)

## 2013-01-09 NOTE — Patient Instructions (Signed)
20 ELYSA WOMAC  01/09/2013   Your procedure is scheduled on:  11/13  Enter through the Main Entrance of [facility] at 730 AM.  Pick up the phone at the desk and dial [phone].   Call this number if you have problems the morning of surgery: [phone]   Remember:   Do not eat food:After Midnight.  Do not drink clear liquids: After Midnight.  Take these medicines the morning of surgery with A SIP OF WATER: NA   Do not wear jewelry, make-up or nail polish.  Do not wear lotions, powders, or perfumes. You may wear deodorant.  Do not shave 48 hours prior to surgery.  Do not bring valuables to the hospital.  [facility] is not   responsible for any belongings or valuables brought to the hospital.  Contacts, dentures or bridgework may not be worn into surgery.  Leave suitcase in the car. After surgery it may be brought to your room.  For patients admitted to the hospital, checkout time is 11:00 AM the day of              discharge.   Patients discharged the day of surgery will not be allowed to drive             home.  Name and phone number of your driver: NA  Special Instructions:   Shower using CHG 2 nights before surgery and the night before surgery.  If you shower the day of surgery use CHG.  Use special wash - you have one bottle of CHG for all showers.  You should use approximately 1/3 of the bottle for each shower.   Please read over the following fact sheets that you were given:   Surgical Site Infection Prevention

## 2013-01-17 MED ORDER — DEXTROSE 5 % IV SOLN
3.0000 g | INTRAVENOUS | Status: AC
  Administered 2013-01-18: 3 g via INTRAVENOUS
  Filled 2013-01-17: qty 3000

## 2013-01-18 ENCOUNTER — Ambulatory Visit (HOSPITAL_COMMUNITY)
Admission: RE | Admit: 2013-01-18 | Discharge: 2013-01-19 | Disposition: A | Discharge status: Home / Self Care | Payer: Managed Care, Other (non HMO) | Source: Ambulatory Visit | Attending: Obstetrics and Gynecology | Admitting: Obstetrics and Gynecology

## 2013-01-18 ENCOUNTER — Encounter (HOSPITAL_COMMUNITY): Payer: Self-pay | Admitting: Anesthesiology

## 2013-01-18 ENCOUNTER — Encounter (HOSPITAL_COMMUNITY): Payer: Managed Care, Other (non HMO) | Admitting: Anesthesiology

## 2013-01-18 ENCOUNTER — Ambulatory Visit (HOSPITAL_COMMUNITY): Payer: Managed Care, Other (non HMO) | Admitting: Anesthesiology

## 2013-01-18 ENCOUNTER — Encounter (HOSPITAL_COMMUNITY)
Admission: RE | Disposition: A | Discharge status: Home / Self Care | Payer: Self-pay | Source: Ambulatory Visit | Attending: Obstetrics and Gynecology

## 2013-01-18 DIAGNOSIS — F17200 Nicotine dependence, unspecified, uncomplicated: Secondary | ICD-10-CM | POA: Insufficient documentation

## 2013-01-18 DIAGNOSIS — D259 Leiomyoma of uterus, unspecified: Secondary | ICD-10-CM

## 2013-01-18 DIAGNOSIS — N946 Dysmenorrhea, unspecified: Secondary | ICD-10-CM | POA: Insufficient documentation

## 2013-01-18 DIAGNOSIS — D250 Submucous leiomyoma of uterus: Secondary | ICD-10-CM | POA: Insufficient documentation

## 2013-01-18 HISTORY — PX: BILATERAL SALPINGECTOMY: SHX5743

## 2013-01-18 HISTORY — PX: LAPAROSCOPIC ASSISTED VAGINAL HYSTERECTOMY: SHX5398

## 2013-01-18 HISTORY — DX: Leiomyoma of uterus, unspecified: D25.9

## 2013-01-18 LAB — CBC
HCT: 30.5 % — ABNORMAL LOW (ref 36.0–46.0)
Hemoglobin: 9.5 g/dL — ABNORMAL LOW (ref 12.0–15.0)
MCV: 79.8 fL (ref 78.0–100.0)
Platelets: 312 10*3/uL (ref 150–400)
RBC: 3.82 MIL/uL — ABNORMAL LOW (ref 3.87–5.11)
WBC: 7.5 10*3/uL (ref 4.0–10.5)

## 2013-01-18 LAB — PREGNANCY, URINE: Preg Test, Ur: NEGATIVE

## 2013-01-18 SURGERY — HYSTERECTOMY, VAGINAL, LAPAROSCOPY-ASSISTED
Anesthesia: General | Site: Abdomen | Wound class: Clean Contaminated

## 2013-01-18 MED ORDER — KETOROLAC TROMETHAMINE 30 MG/ML IJ SOLN
INTRAMUSCULAR | Status: AC
  Filled 2013-01-18: qty 1

## 2013-01-18 MED ORDER — ROCURONIUM BROMIDE 100 MG/10ML IV SOLN
INTRAVENOUS | Status: AC
  Filled 2013-01-18: qty 1

## 2013-01-18 MED ORDER — LACTATED RINGERS IV SOLN
INTRAVENOUS | Status: DC
  Administered 2013-01-18 (×3): via INTRAVENOUS

## 2013-01-18 MED ORDER — SODIUM CHLORIDE 0.9 % IJ SOLN
INTRAMUSCULAR | Status: DC | PRN
  Administered 2013-01-18: 10 mL

## 2013-01-18 MED ORDER — FENTANYL CITRATE 0.05 MG/ML IJ SOLN
INTRAMUSCULAR | Status: DC | PRN
  Administered 2013-01-18 (×4): 50 ug via INTRAVENOUS

## 2013-01-18 MED ORDER — SODIUM CHLORIDE 0.9 % IJ SOLN
INTRAMUSCULAR | Status: AC
  Filled 2013-01-18: qty 50

## 2013-01-18 MED ORDER — LIDOCAINE HCL (CARDIAC) 20 MG/ML IV SOLN
INTRAVENOUS | Status: DC | PRN
  Administered 2013-01-18: 80 mg via INTRAVENOUS

## 2013-01-18 MED ORDER — DEXTROSE-NACL 5-0.45 % IV SOLN
INTRAVENOUS | Status: DC
  Administered 2013-01-18 – 2013-01-19 (×2): via INTRAVENOUS

## 2013-01-18 MED ORDER — PREDNISOLONE ACETATE 1 % OP SUSP: 1.0000 [drp] | Freq: Every day | OPHTHALMIC | Status: DC

## 2013-01-18 MED ORDER — GLYCOPYRROLATE 0.2 MG/ML IJ SOLN
INTRAMUSCULAR | Status: DC | PRN
  Administered 2013-01-18: .8 mg via INTRAVENOUS

## 2013-01-18 MED ORDER — HYDROMORPHONE HCL PF 1 MG/ML IJ SOLN
INTRAMUSCULAR | Status: AC
  Administered 2013-01-18: 0.5 mg via INTRAVENOUS
  Filled 2013-01-18: qty 1

## 2013-01-18 MED ORDER — METOCLOPRAMIDE HCL 5 MG/ML IJ SOLN: 10.0000 mg | Freq: Once | INTRAMUSCULAR | Status: DC | PRN

## 2013-01-18 MED ORDER — SODIUM CHLORIDE 0.9 % IJ SOLN
INTRAMUSCULAR | Status: AC
  Filled 2013-01-18: qty 10

## 2013-01-18 MED ORDER — MIDAZOLAM HCL 2 MG/2ML IJ SOLN
INTRAMUSCULAR | Status: AC
  Filled 2013-01-18: qty 2

## 2013-01-18 MED ORDER — DIPHENHYDRAMINE HCL 50 MG/ML IJ SOLN: 12.5000 mg | Freq: Four times a day (QID) | INTRAMUSCULAR | Status: DC | PRN

## 2013-01-18 MED ORDER — ZOLPIDEM TARTRATE 5 MG PO TABS: 5.0000 mg | ORAL_TABLET | Freq: Every evening | ORAL | Status: DC | PRN

## 2013-01-18 MED ORDER — MEPERIDINE HCL 25 MG/ML IJ SOLN: 6.2500 mg | INTRAMUSCULAR | Status: DC | PRN

## 2013-01-18 MED ORDER — NEOSTIGMINE METHYLSULFATE 1 MG/ML IJ SOLN
INTRAMUSCULAR | Status: AC
  Filled 2013-01-18: qty 1

## 2013-01-18 MED ORDER — LIDOCAINE HCL (CARDIAC) 20 MG/ML IV SOLN
INTRAVENOUS | Status: AC
  Filled 2013-01-18: qty 5

## 2013-01-18 MED ORDER — HYDROMORPHONE 0.3 MG/ML IV SOLN
INTRAVENOUS | Status: DC
  Administered 2013-01-18: 13:00:00 via INTRAVENOUS
  Administered 2013-01-18: 2.59 mg via INTRAVENOUS
  Administered 2013-01-19 (×2): 1.19 mg via INTRAVENOUS
  Filled 2013-01-18: qty 25

## 2013-01-18 MED ORDER — BUPIVACAINE HCL (PF) 0.25 % IJ SOLN
INTRAMUSCULAR | Status: DC | PRN
  Administered 2013-01-18: 10 mL

## 2013-01-18 MED ORDER — KETOROLAC TROMETHAMINE 30 MG/ML IJ SOLN
INTRAMUSCULAR | Status: DC | PRN
  Administered 2013-01-18: 30 mg via INTRAVENOUS

## 2013-01-18 MED ORDER — NEOSTIGMINE METHYLSULFATE 1 MG/ML IJ SOLN
INTRAMUSCULAR | Status: DC | PRN
  Administered 2013-01-18: 5 mg via INTRAVENOUS

## 2013-01-18 MED ORDER — ONDANSETRON HCL 4 MG/2ML IJ SOLN
INTRAMUSCULAR | Status: DC | PRN
  Administered 2013-01-18: 4 mg via INTRAVENOUS

## 2013-01-18 MED ORDER — HYDROMORPHONE HCL PF 1 MG/ML IJ SOLN
0.2500 mg | INTRAMUSCULAR | Status: DC | PRN
Start: 2013-01-18 — End: 2013-01-18
  Administered 2013-01-18 (×3): 0.5 mg via INTRAVENOUS

## 2013-01-18 MED ORDER — HEPARIN SODIUM (PORCINE) 5000 UNIT/ML IJ SOLN
INTRAMUSCULAR | Status: AC
  Filled 2013-01-18: qty 1

## 2013-01-18 MED ORDER — MENTHOL 3 MG MT LOZG: 1.0000 | LOZENGE | OROMUCOSAL | Status: DC | PRN

## 2013-01-18 MED ORDER — KETOROLAC TROMETHAMINE 30 MG/ML IJ SOLN: 30.0000 mg | Freq: Once | INTRAMUSCULAR | Status: DC

## 2013-01-18 MED ORDER — ALUM & MAG HYDROXIDE-SIMETH 200-200-20 MG/5ML PO SUSP: 30.0000 mL | ORAL | Status: DC | PRN

## 2013-01-18 MED ORDER — ARTIFICIAL TEARS OP OINT
TOPICAL_OINTMENT | OPHTHALMIC | Status: AC
  Filled 2013-01-18: qty 3.5

## 2013-01-18 MED ORDER — DIPHENHYDRAMINE HCL 12.5 MG/5ML PO ELIX: 12.5000 mg | ORAL_SOLUTION | Freq: Four times a day (QID) | ORAL | Status: DC | PRN

## 2013-01-18 MED ORDER — SIMETHICONE 80 MG PO CHEW: 80.0000 mg | CHEWABLE_TABLET | Freq: Four times a day (QID) | ORAL | Status: DC | PRN

## 2013-01-18 MED ORDER — VASOPRESSIN 20 UNIT/ML IJ SOLN
INTRAVENOUS | Status: DC | PRN
  Administered 2013-01-18: 10:00:00 via INTRAMUSCULAR

## 2013-01-18 MED ORDER — PROPOFOL 10 MG/ML IV EMUL
INTRAVENOUS | Status: AC
  Filled 2013-01-18: qty 20

## 2013-01-18 MED ORDER — ONDANSETRON HCL 4 MG/2ML IJ SOLN
INTRAMUSCULAR | Status: AC
  Filled 2013-01-18: qty 2

## 2013-01-18 MED ORDER — NALOXONE HCL 0.4 MG/ML IJ SOLN
0.4000 mg | INTRAMUSCULAR | Status: DC | PRN
Start: 2013-01-18 — End: 2013-01-19

## 2013-01-18 MED ORDER — ROCURONIUM BROMIDE 100 MG/10ML IV SOLN
INTRAVENOUS | Status: DC | PRN
  Administered 2013-01-18: 40 mg via INTRAVENOUS
  Administered 2013-01-18: 10 mg via INTRAVENOUS

## 2013-01-18 MED ORDER — SODIUM CHLORIDE 0.9 % IJ SOLN: 9.0000 mL | INTRAMUSCULAR | Status: DC | PRN

## 2013-01-18 MED ORDER — GLYCOPYRROLATE 0.2 MG/ML IJ SOLN
INTRAMUSCULAR | Status: AC
  Filled 2013-01-18: qty 3

## 2013-01-18 MED ORDER — FENTANYL CITRATE 0.05 MG/ML IJ SOLN
INTRAMUSCULAR | Status: AC
  Filled 2013-01-18: qty 5

## 2013-01-18 MED ORDER — OXYCODONE-ACETAMINOPHEN 5-325 MG PO TABS
1.0000 | ORAL_TABLET | ORAL | Status: DC | PRN
  Administered 2013-01-19: 2 via ORAL
  Filled 2013-01-18: qty 2

## 2013-01-18 MED ORDER — ONDANSETRON HCL 4 MG/2ML IJ SOLN: 4.0000 mg | Freq: Four times a day (QID) | INTRAMUSCULAR | Status: DC | PRN

## 2013-01-18 MED ORDER — BUPIVACAINE HCL (PF) 0.25 % IJ SOLN
INTRAMUSCULAR | Status: AC
  Filled 2013-01-18: qty 30

## 2013-01-18 MED ORDER — MIDAZOLAM HCL 2 MG/2ML IJ SOLN
INTRAMUSCULAR | Status: DC | PRN
  Administered 2013-01-18: 2 mg via INTRAVENOUS

## 2013-01-18 MED ORDER — DEXAMETHASONE SODIUM PHOSPHATE 10 MG/ML IJ SOLN
INTRAMUSCULAR | Status: DC | PRN
  Administered 2013-01-18: 10 mg via INTRAVENOUS

## 2013-01-18 MED ORDER — VASOPRESSIN 20 UNIT/ML IJ SOLN
INTRAMUSCULAR | Status: AC
  Filled 2013-01-18: qty 1

## 2013-01-18 MED ORDER — DEXAMETHASONE SODIUM PHOSPHATE 10 MG/ML IJ SOLN
INTRAMUSCULAR | Status: AC
  Filled 2013-01-18: qty 1

## 2013-01-18 MED ORDER — PROPOFOL 10 MG/ML IV BOLUS
INTRAVENOUS | Status: DC | PRN
  Administered 2013-01-18: 200 mg via INTRAVENOUS

## 2013-01-18 MED ORDER — LACTATED RINGERS IR SOLN
Status: DC | PRN
  Administered 2013-01-18: 3000 mL

## 2013-01-18 SURGICAL SUPPLY — 37 items
ADH SKN CLS APL DERMABOND .7 (GAUZE/BANDAGES/DRESSINGS) ×2
CATH ROBINSON RED A/P 16FR (CATHETERS) ×3 IMPLANT
CHLORAPREP W/TINT 26ML (MISCELLANEOUS) ×3 IMPLANT
CLOTH BEACON ORANGE TIMEOUT ST (SAFETY) ×3 IMPLANT
CONT PATH 16OZ SNAP LID 3702 (MISCELLANEOUS) ×3 IMPLANT
COVER TABLE BACK 60X90 (DRAPES) ×3 IMPLANT
DECANTER SPIKE VIAL GLASS SM (MISCELLANEOUS) ×4 IMPLANT
DERMABOND ADVANCED (GAUZE/BANDAGES/DRESSINGS) ×1
DERMABOND ADVANCED .7 DNX12 (GAUZE/BANDAGES/DRESSINGS) ×2 IMPLANT
ELECT REM PT RETURN 9FT ADLT (ELECTROSURGICAL) ×3
ELECTRODE REM PT RTRN 9FT ADLT (ELECTROSURGICAL) IMPLANT
GLOVE BIO SURGEON STRL SZ 6.5 (GLOVE) ×3 IMPLANT
GLOVE BIO SURGEON STRL SZ8 (GLOVE) ×3 IMPLANT
GLOVE BIOGEL PI IND STRL 6.5 (GLOVE) ×2 IMPLANT
GLOVE BIOGEL PI IND STRL 7.0 (GLOVE) IMPLANT
GLOVE BIOGEL PI INDICATOR 6.5 (GLOVE) ×2
GLOVE BIOGEL PI INDICATOR 7.0 (GLOVE) ×2
GLOVE ORTHO TXT STRL SZ7.5 (GLOVE) ×6 IMPLANT
GOWN STRL REIN XL XLG (GOWN DISPOSABLE) ×12 IMPLANT
NEEDLE INSUFFLATION 120MM (ENDOMECHANICALS) ×3 IMPLANT
NS IRRIG 1000ML POUR BTL (IV SOLUTION) ×3 IMPLANT
PACK LAVH (CUSTOM PROCEDURE TRAY) ×3 IMPLANT
PROTECTOR NERVE ULNAR (MISCELLANEOUS) ×6 IMPLANT
SCALPEL HARMONIC ACE (MISCELLANEOUS) ×3 IMPLANT
SET CYSTO W/LG BORE CLAMP LF (SET/KITS/TRAYS/PACK) IMPLANT
SET IRRIG TUBING LAPAROSCOPIC (IRRIGATION / IRRIGATOR) ×3 IMPLANT
SHEET LAVH (DRAPES) ×1 IMPLANT
SUT CHROMIC 1MO 4 18 CR8 (SUTURE) ×6 IMPLANT
SUT CHROMIC GUT AB #0 18 (SUTURE) ×3 IMPLANT
SUT SILK 2 0 SH (SUTURE) ×2 IMPLANT
SUT VIC AB 2-0 CT1 (SUTURE) ×3 IMPLANT
SUT VICRYL 4-0 PS2 18IN ABS (SUTURE) ×3 IMPLANT
TOWEL OR 17X24 6PK STRL BLUE (TOWEL DISPOSABLE) ×6 IMPLANT
TRAY FOLEY CATH 14FR (SET/KITS/TRAYS/PACK) ×3 IMPLANT
TROCAR XCEL NON-BLD 5MMX100MML (ENDOMECHANICALS) ×9 IMPLANT
WARMER LAPAROSCOPE (MISCELLANEOUS) ×3 IMPLANT
WATER STERILE IRR 1000ML POUR (IV SOLUTION) ×2 IMPLANT

## 2013-01-18 NOTE — Transfer of Care (Signed)
Immediate Anesthesia Transfer of Care Note  Patient: Julie Munoz  Procedure(s) Performed: Procedure(s): LAPAROSCOPIC ASSISTED VAGINAL HYSTERECTOMY (N/A) BILATERAL SALPINGECTOMY (Bilateral)  Patient Location: PACU  Anesthesia Type:General  Level of Consciousness: awake, alert  and oriented  Airway & Oxygen Therapy: Patient Spontanous Breathing and Patient connected to nasal cannula oxygen  Post-op Assessment: Report given to PACU RN, Post -op Vital signs reviewed and stable and Patient moving all extremities X 4  Post vital signs: Reviewed and stable  Complications: No apparent anesthesia complications

## 2013-01-18 NOTE — Preoperative (Signed)
Beta Blockers   Reason not to administer Beta Blockers:Not Applicable 

## 2013-01-18 NOTE — Progress Notes (Signed)
Patient ID: Julie Munoz, female   DOB: 09-29-1975, 37 y.o.   MRN: [id number] DOS Pt states she is more uncomfortable than she thought she would be. The abdomen is soft Urine is clear and about 200 cc's in the bag since 3 PM Pulse is normal and BP normal

## 2013-01-18 NOTE — Anesthesia Preprocedure Evaluation (Addendum)
Anesthesia Evaluation  Patient identified by MRN, date of birth, ID band Patient awake    Reviewed: Allergy & Precautions, H&P , NPO status , Patient's Chart, lab work & pertinent test results  Airway Mallampati: II TM Distance: >3 FB Neck ROM: Full    Dental no notable dental hx. (+) Teeth Intact   Pulmonary asthma , Current Smoker,  breath sounds clear to auscultation  Pulmonary exam normal       Cardiovascular negative cardio ROS  Rhythm:Regular Rate:Normal     Neuro/Psych S/P corneal transplant-Bilaterally Hx/o keratoconus negative psych ROS   GI/Hepatic negative GI ROS, Neg liver ROS,   Endo/Other  Morbid obesity  Renal/GU negative Renal ROS  negative genitourinary   Musculoskeletal negative musculoskeletal ROS (+)   Abdominal (+) + obese,   Peds  Hematology negative hematology ROS (+)   Anesthesia Other Findings   Reproductive/Obstetrics Pelvic pain Fibroid uterus                         Anesthesia Physical Anesthesia Plan  ASA: III  Anesthesia Plan: General   Post-op Pain Management:    Induction: Intravenous  Airway Management Planned: Oral ETT  Additional Equipment:   Intra-op Plan:   Post-operative Plan: Extubation in OR  Informed Consent: I have reviewed the patients History and Physical, chart, labs and discussed the procedure including the risks, benefits and alternatives for the proposed anesthesia with the patient or authorized representative who has indicated his/her understanding and acceptance.   Dental advisory given  Plan Discussed with: CRNA, Anesthesiologist and Surgeon  Anesthesia Plan Comments:         Anesthesia Quick Evaluation

## 2013-01-18 NOTE — Op Note (Signed)
Preoperative diagnosis: Symptomatic myomatous uterus Postoperative diagnosis:  Same Procedure: Laparoscopic-assisted vaginal hysterectomy, bilateral salpingectomy Surgeon: Lavina Hamman M.D. Assistant: Huel Cote, MD Anesthesia: Gen. Endotracheal tube Findings: She had a normal upper abdomen. Uterus was slightly enlarged with myomas, normal tubes and ovaries Estimated blood loss: 200 cc Specimens: Uterus with tubes sent for routine pathology Complications: None  Procedure in detail: The patient was taken to the operating room and placed in the dorsosupine position. General anesthesia was induced and legs were placed in mobile stirrups and arms tucked to her sides. Abdomen and perineum were then prepped and draped in usual sterile fashion, bladder drained with a red Robinson catheter, Hulka tenaculum applied to the cervix for uterine manipulation. Infraumbilical skin was then infiltrated with quarter percent Marcaine and a 1 cm vertical incision was made. Veress needle was inserted into the peritoneal cavity and placement confirmed by the water drop test an opening pressure of 6 mm mercury. CO2 was insufflated to a pressure of 13 mm mercury and the Veress needle was removed. A 5 mm trocar was introduced with direct visualization. A 5 mm port was then placed on the left side also under direct visualization. Inspection revealed the above-mentioned findings. A third 5 mm port was placed on the right side also under direct visualization.  The distal right fallopian tube was grasped, and the harmonic scalpel Ace was used to take down the mesosalpinx to free the tube.  The right uterine cornu was grasped with a 5 mm tenaculum. The Harmonic Scalpel ACE was then used to take down the right utero-ovarian pedicle, then came through the right round ligament, right broad ligament and incised the anterior peritoneum across the anterior lower part of the uterus. A similar procedure was then performed on the left  side taking down the mesosalpinx, utero-ovarian pedicle, round ligament, broad ligament. Anterior peritoneum was incised across the anterior part of the uterus to meet the incision coming from the right side. At this point the uterus was fairly free and there is adequate hemostasis to proceed vaginally.  The legs were elevated in stirrups. A weighted speculum was inserted in the vagina. The cervix was grasped with Christella Hartigan tenaculums. A dilute solution of Pitressin was infiltrated around the cervicovaginal junction which was then incised circumferentially with electrocautery. Sharp dissection was then used to further free the vagina from the cervix. Anterior peritoneum was identified and entered sharply. A Deaver retractor was then to retract the bladder anterior. Posterior cul-de-sac was identified and entered sharply. A Bonnano speculum was placed into the posterior cul-de-sac. Uterosacral ligaments were clamped, transected and ligated with #1 chromic and tagged for later use. Cardinal ligaments and uterine arteries were likewise clamped transected and ligated with #1 chromic. The remaining pedicles were clamped transected and ligated and the uterus was removed. No significant bleeding from any pedicles was identified. The uterosacral ligaments were plicated in the midline with 2-0 silk and the previously tagged uterosacral pedicles were also tied in the midline. The vaginal cuff was then closed in a vertical fashion with running locking 2-0 Vicryl with adequate closure and adequate hemostasis. A Foley catheter was then placed.  Attention was turned back to the abdomen. Dr. Senaida Ores and the scrub tech and myself all changed gloves. The abdomen was reinsufflated. Laparoscope was reinserted and good visualization was achieved. No significant bleeding was identified. Both ureters were identified and found to be below incision lines. The 5 mm port on each side was removed under direct visualization. The  laparoscope  was then removed and all gas was allowed to deflate from the abdomen. The umbilical trocar was then removed. Skin incisions were closed with interrupted subcuticular sutures of 4-0 Vicryl followed by Dermabond. The patient was awakened in the operating room and taken to the recovery room in stable condition after tolerating the procedure well. Counts were correct x2, she received Ancef 3 g IV at the beginning of the procedure, she had PAS hose on throughout the procedure.

## 2013-01-18 NOTE — Anesthesia Postprocedure Evaluation (Signed)
  Anesthesia Post-op Note  Patient: Julie Munoz  Procedure(s) Performed: Procedure(s): LAPAROSCOPIC ASSISTED VAGINAL HYSTERECTOMY (N/A) BILATERAL SALPINGECTOMY (Bilateral)  Patient Location: PACU  Anesthesia Type:General  Level of Consciousness: awake, alert  and oriented  Airway and Oxygen Therapy: Patient Spontanous Breathing  Post-op Pain: mild  Post-op Assessment: Post-op Vital signs reviewed, Patient's Cardiovascular Status Stable, Respiratory Function Stable, Patent Airway, No signs of Nausea or vomiting and Pain level controlled  Post-op Vital Signs: Reviewed and stable  Complications: No apparent anesthesia complications

## 2013-01-18 NOTE — H&P (Addendum)
Julie Munoz is an 37 y.o. female. She has known myomas and had wanted to maintain fertility.  In June of this year she had a laparoscopy for pelvic pain which was normal, at the same time I did a hysteroscopy and resected a large submucosal myoma.  Postoperatively she continued to have heavy, painful menses.  Repeat pelvic ultrasound reveals that the submucosal myoma was not resected completely.  Options including repeat hysteroscopy with resection vs. Definitive surgical therapy were discussed, she now wants definitive surgical therapy.  Pertinent Gynecological History: Last pap: normal Date: 2010 OB History: G1, P1001   Menstrual History: No LMP recorded.    Past Medical History  Diagnosis Date  . Asthma     no inhaler -no problem as adult  . Environmental allergies     Past Surgical History  Procedure Laterality Date  . Cesarean section    . Corneal transplant      surgeries x 3, bilateral left x 2, right x 1  . Cholecystectomy    . Laparoscopy N/A 08/08/2012    Procedure: LAPAROSCOPY OPERATIVE;  Surgeon: Lavina Hamman, MD;  Location: [facility];  Service: Gynecology;  Laterality: N/A;  Hysteroscopy with resection of submucosal myoma  Family History  Problem Relation Age of Onset  . Asthma Mother     Social History:  reports that she has been smoking Cigarettes.  She has a 5 pack-year smoking history. She has never used smokeless tobacco. She reports that she drinks alcohol. She reports that she does not use illicit drugs.  Allergies:  Allergies  Allergen Reactions  . Shellfish Allergy Anaphylaxis  . Aspirin Other (See Comments)    Allergy unknown Pt can take ibuprofen without problems    Prescriptions prior to admission  Medication Sig Dispense Refill  . HYDROcodone-acetaminophen (NORCO) 5-325 MG per tablet Take 1-2 tablets by mouth every 6 (six) hours as needed for pain.  20 tablet  0  . Iron TABS Take 1 tablet by mouth. OTC      . prednisoLONE acetate (PRED  FORTE) 1 % ophthalmic suspension Place 1 drop into both eyes daily.         Review of Systems  Respiratory: Negative.   Cardiovascular: Negative.   Gastrointestinal: Negative.   Genitourinary: Negative.     Blood pressure 132/64, pulse 71, temperature 97.3 F (36.3 C), temperature source Oral, resp. rate 20, SpO2 98.00%. Physical Exam  Constitutional: She appears well-developed and well-nourished.  Neck: Neck supple. No thyromegaly present.  Cardiovascular: Normal rate, regular rhythm and normal heart sounds.   No murmur heard. Respiratory: Effort normal and breath sounds normal. No respiratory distress.  GI: Soft. She exhibits no distension and no mass. There is no tenderness.  Transverse scar  Genitourinary: Vagina normal.  Uterus is slightly enlarged and irregular No adnexal mass    Results for orders placed during the hospital encounter of 01/18/13 (from the past 24 hour(s))  PREGNANCY, URINE     Status: None   Collection Time    01/18/13  7:20 AM      Result Value Range   Preg Test, Ur NEGATIVE  NEGATIVE    No results found.  Assessment/Plan: Symptomatic myomatous uterus.  She has a large submucosal myoma which has been partially resected hysteroscopically as she initially wanted to preserve fertility.  She now wishes to proceed with definitive surgical therapy with hysterectomy instead of repeat hysteroscopy with resection.  Hysterectomy procedure, risks, alternatives, chances of relieving pain and bleeding have all  been discussed.  Will admit for LAVH/bilateral salpingectomy.  Hemoglobin was 8.7 last week, she declines blood transfusion.  Julie Munoz D 01/18/2013, 8:04 AM

## 2013-01-18 NOTE — Anesthesia Postprocedure Evaluation (Signed)
  Anesthesia Post-op Note  Patient: Julie Munoz  Procedure(s) Performed: Procedure(s): LAPAROSCOPIC ASSISTED VAGINAL HYSTERECTOMY (N/A) BILATERAL SALPINGECTOMY (Bilateral)  Patient Location: [facility] Unit  Anesthesia Type:General  Level of Consciousness: awake, alert  and oriented  Airway and Oxygen Therapy: Patient Spontanous Breathing and Patient connected to nasal cannula oxygen  Post-op Pain: none  Post-op Assessment: Post-op Vital signs reviewed and Patient's Cardiovascular Status Stable  Post-op Vital Signs: Reviewed and stable  Complications: No apparent anesthesia complications

## 2013-01-18 NOTE — Interval H&P Note (Signed)
History and Physical Interval Note:  01/18/2013 8:16 AM  Julie Munoz  has presented today for surgery, with the diagnosis of Leiomyoma of Uterus,  The various methods of treatment have been discussed with the patient and family. After consideration of risks, benefits and other options for treatment, the patient has consented to  Procedure(s): LAPAROSCOPIC ASSISTED VAGINAL HYSTERECTOMY (N/A) BILATERAL SALPINGECTOMY (Bilateral) as a surgical intervention .  The patient's history has been reviewed, patient examined, no change in status, stable for surgery.  I have reviewed the patient's chart and labs.  Questions were answered to the patient's satisfaction.     Shauntavia Brackin D

## 2013-01-19 LAB — CBC
HCT: 27.3 % — ABNORMAL LOW (ref 36.0–46.0)
Hemoglobin: 8.6 g/dL — ABNORMAL LOW (ref 12.0–15.0)
MCH: 24.8 pg — ABNORMAL LOW (ref 26.0–34.0)
RBC: 3.47 MIL/uL — ABNORMAL LOW (ref 3.87–5.11)
RDW: 20.3 % — ABNORMAL HIGH (ref 11.5–15.5)

## 2013-01-19 MED ORDER — OXYCODONE-ACETAMINOPHEN 5-325 MG PO TABS: 1.0000 | ORAL_TABLET | ORAL | Status: DC | PRN

## 2013-01-19 MED FILL — Heparin Sodium (Porcine) Inj 5000 Unit/ML: INTRAMUSCULAR | Qty: 1 | Status: AC

## 2013-01-19 NOTE — Progress Notes (Signed)
POD #1 Doing well, sore Afeb, VSS Abd- soft, incisions intact Will d/c foley, ambulate, d/c PCA, d/c home later today unless has a problem

## 2013-01-20 ENCOUNTER — Encounter (HOSPITAL_COMMUNITY): Payer: Self-pay | Admitting: Obstetrics and Gynecology

## 2013-01-21 NOTE — Discharge Summary (Signed)
Physician Discharge Summary  Patient ID: Julie Munoz MRN: [id number] DOB/AGE: [birth year] 37 y.o.  Admit date: 01/18/2013 Discharge date: 01/19/2013  Admission Diagnoses:  Symptomatic myomatous uterus  Discharge Diagnoses:  Same Active Problems:   Fibroid uterus   Discharged Condition: good  Hospital Course: Pt underwent LAVH/bil salpingectomy without complications, no problems post-op  Treatments: surgery: LAVH/bilateral salpingectomy  Discharge Exam: Blood pressure 116/51, pulse 63, temperature 98.1 F (36.7 C), temperature source Oral, resp. rate 20, SpO2 99.00%. General appearance: alert  Disposition: 01-Home or Self Care  Discharge Orders   Future Orders Complete By Expires   Diet - low sodium heart healthy  As directed    Increase activity slowly  As directed    Lifting restrictions  As directed    Comments:     10 lbs   Sexual Activity Restrictions  As directed    Comments:     Pelvic rest       Medication List    STOP taking these medications       HYDROcodone-acetaminophen 5-325 MG per tablet  Commonly known as:  NORCO      TAKE these medications       Iron Tabs  Take 1 tablet by mouth. OTC     oxyCODONE-acetaminophen 5-325 MG per tablet  Commonly known as:  PERCOCET/ROXICET  Take 1-2 tablets by mouth every 4 (four) hours as needed for severe pain (moderate to severe pain (when tolerating fluids)).     prednisoLONE acetate 1 % ophthalmic suspension  Commonly known as:  PRED FORTE  Place 1 drop into both eyes daily.           Follow-up Information   Follow up with Sheray Grist D, MD. Schedule an appointment as soon as possible for a visit in 2 weeks.   Specialty:  Obstetrics and Gynecology   Contact information:   [address] [place] [address] [phone]       Signed: Zenaida Niece 01/21/2013, 9:56 AM

## 2013-02-02 ENCOUNTER — Telehealth (HOSPITAL_COMMUNITY): Payer: Self-pay

## 2013-02-02 ENCOUNTER — Emergency Department (HOSPITAL_COMMUNITY)
Admission: EM | Admit: 2013-02-02 | Discharge: 2013-02-02 | Disposition: A | Discharge status: Home / Self Care | Payer: Managed Care, Other (non HMO) | Attending: Emergency Medicine | Admitting: Emergency Medicine

## 2013-02-02 ENCOUNTER — Encounter (HOSPITAL_COMMUNITY): Payer: Self-pay | Admitting: Emergency Medicine

## 2013-02-02 ENCOUNTER — Emergency Department (HOSPITAL_COMMUNITY): Payer: Managed Care, Other (non HMO)

## 2013-02-02 DIAGNOSIS — Z90710 Acquired absence of both cervix and uterus: Secondary | ICD-10-CM | POA: Insufficient documentation

## 2013-02-02 DIAGNOSIS — G8918 Other acute postprocedural pain: Secondary | ICD-10-CM | POA: Insufficient documentation

## 2013-02-02 DIAGNOSIS — J45901 Unspecified asthma with (acute) exacerbation: Secondary | ICD-10-CM | POA: Insufficient documentation

## 2013-02-02 DIAGNOSIS — R109 Unspecified abdominal pain: Secondary | ICD-10-CM | POA: Insufficient documentation

## 2013-02-02 DIAGNOSIS — R1013 Epigastric pain: Secondary | ICD-10-CM | POA: Insufficient documentation

## 2013-02-02 DIAGNOSIS — J029 Acute pharyngitis, unspecified: Secondary | ICD-10-CM | POA: Insufficient documentation

## 2013-02-02 DIAGNOSIS — J189 Pneumonia, unspecified organism: Secondary | ICD-10-CM | POA: Insufficient documentation

## 2013-02-02 DIAGNOSIS — R911 Solitary pulmonary nodule: Secondary | ICD-10-CM | POA: Diagnosis present

## 2013-02-02 DIAGNOSIS — F17200 Nicotine dependence, unspecified, uncomplicated: Secondary | ICD-10-CM | POA: Insufficient documentation

## 2013-02-02 DIAGNOSIS — Z3202 Encounter for pregnancy test, result negative: Secondary | ICD-10-CM | POA: Insufficient documentation

## 2013-02-02 DIAGNOSIS — R197 Diarrhea, unspecified: Secondary | ICD-10-CM | POA: Insufficient documentation

## 2013-02-02 DIAGNOSIS — N898 Other specified noninflammatory disorders of vagina: Secondary | ICD-10-CM | POA: Insufficient documentation

## 2013-02-02 DIAGNOSIS — IMO0002 Reserved for concepts with insufficient information to code with codable children: Secondary | ICD-10-CM | POA: Insufficient documentation

## 2013-02-02 HISTORY — DX: Pneumonia, unspecified organism: J18.9

## 2013-02-02 HISTORY — DX: Solitary pulmonary nodule: R91.1

## 2013-02-02 LAB — CBC WITH DIFFERENTIAL/PLATELET
Basophils Absolute: 0 10*3/uL (ref 0.0–0.1)
Basophils Relative: 0 % (ref 0–1)
Eosinophils Absolute: 0.4 10*3/uL (ref 0.0–0.7)
Hemoglobin: 11.3 g/dL — ABNORMAL LOW (ref 12.0–15.0)
Lymphocytes Relative: 10 % — ABNORMAL LOW (ref 12–46)
MCH: 25.5 pg — ABNORMAL LOW (ref 26.0–34.0)
MCHC: 31.7 g/dL (ref 30.0–36.0)
Monocytes Relative: 7 % (ref 3–12)
Neutro Abs: 9 10*3/uL — ABNORMAL HIGH (ref 1.7–7.7)
Neutrophils Relative %: 79 % — ABNORMAL HIGH (ref 43–77)
Platelets: 304 10*3/uL (ref 150–400)
RDW: 20.7 % — ABNORMAL HIGH (ref 11.5–15.5)

## 2013-02-02 LAB — COMPREHENSIVE METABOLIC PANEL
ALT: 16 U/L (ref 0–35)
AST: 19 U/L (ref 0–37)
Albumin: 3.6 g/dL (ref 3.5–5.2)
Alkaline Phosphatase: 73 U/L (ref 39–117)
Calcium: 9.1 mg/dL (ref 8.4–10.5)
Chloride: 100 mEq/L (ref 96–112)
Glucose, Bld: 144 mg/dL — ABNORMAL HIGH (ref 70–99)
Potassium: 4 mEq/L (ref 3.5–5.1)
Sodium: 134 mEq/L — ABNORMAL LOW (ref 135–145)
Total Bilirubin: 0.3 mg/dL (ref 0.3–1.2)
Total Protein: 7.9 g/dL (ref 6.0–8.3)

## 2013-02-02 LAB — URINALYSIS, ROUTINE W REFLEX MICROSCOPIC
Bilirubin Urine: NEGATIVE
Glucose, UA: NEGATIVE mg/dL
Ketones, ur: NEGATIVE mg/dL
Nitrite: NEGATIVE
Specific Gravity, Urine: 1.014 (ref 1.005–1.030)
pH: 6 (ref 5.0–8.0)

## 2013-02-02 LAB — TROPONIN I: Troponin I: <0.3 ng/mL (ref <0.30)

## 2013-02-02 LAB — RAPID STREP SCREEN (MED CTR MEBANE ONLY): Streptococcus, Group A Screen (Direct): NEGATIVE

## 2013-02-02 LAB — PREGNANCY, URINE: Preg Test, Ur: NEGATIVE

## 2013-02-02 LAB — D-DIMER, QUANTITATIVE: D-Dimer, Quant: 1.29 ug/mL-FEU — ABNORMAL HIGH (ref 0.00–0.48)

## 2013-02-02 MED ORDER — PIPERACILLIN-TAZOBACTAM 3.375 G IVPB 30 MIN
3.3750 g | INTRAVENOUS | Status: AC
  Administered 2013-02-02: 3.375 g via INTRAVENOUS
  Filled 2013-02-02: qty 50

## 2013-02-02 MED ORDER — ALBUTEROL SULFATE (5 MG/ML) 0.5% IN NEBU: 2.5000 mg | INHALATION_SOLUTION | RESPIRATORY_TRACT | Status: DC | PRN

## 2013-02-02 MED ORDER — PREDNISONE 50 MG PO TABS: 50.0000 mg | ORAL_TABLET | Freq: Every day | ORAL | Status: DC

## 2013-02-02 MED ORDER — ALBUTEROL SULFATE HFA 108 (90 BASE) MCG/ACT IN AERS: 2.0000 | INHALATION_SPRAY | RESPIRATORY_TRACT | Status: DC | PRN

## 2013-02-02 MED ORDER — PREDNISONE 20 MG PO TABS: 40.0000 mg | ORAL_TABLET | Freq: Every day | ORAL | Status: DC

## 2013-02-02 MED ORDER — LEVOFLOXACIN 750 MG PO TABS: 750.0000 mg | ORAL_TABLET | Freq: Every day | ORAL | Status: DC

## 2013-02-02 MED ORDER — PIPERACILLIN-TAZOBACTAM 3.375 G IVPB
3.3750 g | Freq: Three times a day (TID) | INTRAVENOUS | Status: DC
  Filled 2013-02-02: qty 50

## 2013-02-02 MED ORDER — IPRATROPIUM BROMIDE 0.02 % IN SOLN
0.5000 mg | Freq: Once | RESPIRATORY_TRACT | Status: AC
  Administered 2013-02-02: 0.5 mg via RESPIRATORY_TRACT
  Filled 2013-02-02: qty 2.5

## 2013-02-02 MED ORDER — OXYCODONE-ACETAMINOPHEN 5-325 MG PO TABS
1.0000 | ORAL_TABLET | Freq: Once | ORAL | Status: AC
  Administered 2013-02-02: 1 via ORAL
  Filled 2013-02-02: qty 1

## 2013-02-02 MED ORDER — ALBUTEROL SULFATE HFA 108 (90 BASE) MCG/ACT IN AERS: 1.0000 | INHALATION_SPRAY | RESPIRATORY_TRACT | Status: DC | PRN

## 2013-02-02 MED ORDER — IOHEXOL 350 MG/ML SOLN
100.0000 mL | Freq: Once | INTRAVENOUS | Status: AC | PRN
  Administered 2013-02-02: 100 mL via INTRAVENOUS

## 2013-02-02 MED ORDER — ALBUTEROL SULFATE (5 MG/ML) 0.5% IN NEBU
5.0000 mg | INHALATION_SOLUTION | Freq: Once | RESPIRATORY_TRACT | Status: AC
  Administered 2013-02-02: 5 mg via RESPIRATORY_TRACT
  Filled 2013-02-02: qty 1

## 2013-02-02 MED ORDER — VANCOMYCIN HCL 10 G IV SOLR
2000.0000 mg | Freq: Once | INTRAVENOUS | Status: AC
  Administered 2013-02-02: 2000 mg via INTRAVENOUS
  Filled 2013-02-02: qty 2000

## 2013-02-02 MED ORDER — LEVOFLOXACIN 500 MG PO TABS: 750.0000 mg | ORAL_TABLET | Freq: Every day | ORAL | Status: DC

## 2013-02-02 MED ORDER — VANCOMYCIN HCL 10 G IV SOLR
1250.0000 mg | Freq: Two times a day (BID) | INTRAVENOUS | Status: DC
  Filled 2013-02-02: qty 1250

## 2013-02-02 MED ORDER — PREDNISONE 20 MG PO TABS
60.0000 mg | ORAL_TABLET | Freq: Once | ORAL | Status: AC
  Administered 2013-02-02: 60 mg via ORAL
  Filled 2013-02-02: qty 3

## 2013-02-02 NOTE — ED Provider Notes (Signed)
CSN: [id number]     Arrival date & time 02/02/13  1478 History   None    Chief Complaint  Patient presents with  . URI    HPI  Julie Munoz is a 37 y.o. female with a PMH of asthma and allergies who presents to the ED for evaluation of URI.  History was provided by the patient.  Patient states that she has had URI-like symptoms for the past 2 days.  She complains of a sore throat, rhinorrhea, nasal congestion, shortness of breath, chest pain, cough, dyspnea, and fever.  Max temp was 102F.  She has a hx of asthma exacerbations but has never had an asthma attack since she was a child.  She does not have an inhaler.  She denies any sick contacts.  She is a previous tobacco user.  Her cough is productive with blood tinged sputum production.  Her chest pain is described as a sharp and pressure like sensation for the past 1.5 days.  She denies any hx of cardiac disease or heart problems in the past.  No hx of clotting/PE/DVT.  No recent travel.  She recently had a TAH on 01/18/13 and has been having continued abdominal pain ever since.  Her pain has worsened the past few days due to her coughing.  Her pain is located in the epigastric and lower middle abdomen.  She had three episodes of diarrhea last night.  No hematochezia.  She has been having vaginal spotting with no vaginal discharge.  No dysuria.  No vomiting or nausea.  No headaches.  She had lightheadedness and dizziness, however, denies this currently.  She states it occurred after "blowing her nose" and resolved.      Past Medical History  Diagnosis Date  . Asthma     no inhaler -no problem as adult  . Environmental allergies    Past Surgical History  Procedure Laterality Date  . Cesarean section    . Corneal transplant      surgeries x 3, bilateral left x 2, right x 1  . Cholecystectomy    . Laparoscopy N/A 08/08/2012    Procedure: LAPAROSCOPY OPERATIVE;  Surgeon: Lavina Hamman, MD;  Location: [facility];  Service: Gynecology;   Laterality: N/A;  . Laparoscopic assisted vaginal hysterectomy N/A 01/18/2013    Procedure: LAPAROSCOPIC ASSISTED VAGINAL HYSTERECTOMY;  Surgeon: Lavina Hamman, MD;  Location: [facility];  Service: Gynecology;  Laterality: N/A;  . Bilateral salpingectomy Bilateral 01/18/2013    Procedure: BILATERAL SALPINGECTOMY;  Surgeon: Lavina Hamman, MD;  Location: [facility];  Service: Gynecology;  Laterality: Bilateral;  . Abdominal hysterectomy     Family History  Problem Relation Age of Onset  . Asthma Mother    History  Substance Use Topics  . Smoking status: Current Every Day Smoker -- 0.50 packs/day for 10 years    Types: Cigarettes  . Smokeless tobacco: Never Used  . Alcohol Use: Yes     Comment: socially   OB History   Grav Para Term Preterm Abortions TAB SAB Ect Mult Living   1 1        1      Review of Systems  Constitutional: Positive for fever and fatigue. Negative for chills, diaphoresis, activity change and appetite change.  HENT: Positive for congestion, rhinorrhea and sore throat. Negative for ear pain, sinus pressure, trouble swallowing and voice change.   Eyes: Negative for visual disturbance.  Respiratory: Positive for cough, shortness of breath and wheezing. Negative for choking and  chest tightness.   Cardiovascular: Positive for chest pain. Negative for palpitations and leg swelling.  Gastrointestinal: Positive for diarrhea. Negative for nausea, vomiting, abdominal pain and constipation.  Genitourinary: Positive for vaginal bleeding. Negative for dysuria, frequency, hematuria, flank pain, decreased urine volume, vaginal discharge, difficulty urinating, vaginal pain and pelvic pain.  Musculoskeletal: Negative for back pain.  Skin: Negative for color change.  Neurological: Positive for dizziness (resolved) and light-headedness (resolved). Negative for weakness and headaches.    Allergies  Shellfish allergy and Aspirin  Home Medications   Current Outpatient Rx  Name  Route   Sig  Dispense  Refill  . Iron TABS   Oral   Take 1 tablet by mouth. OTC         . oxyCODONE-acetaminophen (PERCOCET/ROXICET) 5-325 MG per tablet   Oral   Take 1-2 tablets by mouth every 4 (four) hours as needed for severe pain (moderate to severe pain (when tolerating fluids)).   30 tablet   0   . prednisoLONE acetate (PRED FORTE) 1 % ophthalmic suspension   Both Eyes   Place 1 drop into both eyes daily.           BP 132/84  Pulse 95  Temp(Src) 98.3 F (36.8 C)  Resp 28  SpO2 98%  LMP 12/26/2012  Filed Vitals:   02/02/13 0734 02/02/13 1100 02/02/13 1150 02/02/13 1405  BP: 116/63  121/66 120/67  Pulse: 82 94 101 88  Temp:   97.8 F (36.6 C)   TempSrc:   Oral   Resp: 25  20 18   SpO2: 95% 100% 99% 96%    Physical Exam  Nursing note and vitals reviewed. Constitutional: She is oriented to person, place, and time. She appears well-developed and well-nourished. No distress.  HENT:  Head: Normocephalic and atraumatic.  Right Ear: External ear normal.  Left Ear: External ear normal.  Nose: Nose normal.  Mouth/Throat: Oropharynx is clear and moist. No oropharyngeal exudate.  Eyes: Conjunctivae are normal. Pupils are equal, round, and reactive to light. Right eye exhibits no discharge. Left eye exhibits no discharge.  Neck: Normal range of motion. Neck supple.  Cardiovascular: Normal rate, regular rhythm, normal heart sounds and intact distal pulses.  Exam reveals no gallop and no friction rub.   No murmur heard. Pulmonary/Chest: Effort normal. No respiratory distress. She has wheezes. She has no rales. She exhibits tenderness.  Decreased breath sounds throughout.  Inspiratory and expiratory wheezing heard throughout.  Mid-sternal tenderness to palpation.    Abdominal: Soft. Bowel sounds are normal. She exhibits no distension and no mass. There is no tenderness. There is no rebound and no guarding.  Surgical scars present from laparoscopic surgery which are  clean/dry/intact with no surrounding edema/erythema/drainage  Musculoskeletal: Normal range of motion. She exhibits no edema and no tenderness.  Neurological: She is alert and oriented to person, place, and time.  Skin: Skin is warm and dry. She is not diaphoretic.    ED Course  Procedures (including critical care time) Labs Review Labs Reviewed - No data to display Imaging Review No results found.  EKG Interpretation    Date/Time:  Friday February 02 2013 14:23:38 EST Ventricular Rate:  89 PR Interval:  189 QRS Duration: 78 QT Interval:  388 QTC Calculation: 472 R Axis:   40 Text Interpretation:  Sinus rhythm Borderline T wave abnormalities Baseline wander in lead(s) V2 Confirmed by Freida Busman  MD, ANTHONY ([id number]) on 02/02/2013 2:29:40 PM  Results for orders placed during the hospital encounter of 02/02/13  RAPID STREP SCREEN      Result Value Range   Streptococcus, Group A Screen (Direct) NEGATIVE  NEGATIVE  CULTURE, GROUP A STREP      Result Value Range   Specimen Description THROAT     Special Requests NONE     Culture       Value: NO SUSPICIOUS COLONIES, CONTINUING TO HOLD     Performed at [facility]   Report Status PENDING    CBC WITH DIFFERENTIAL      Result Value Range   WBC 11.4 (*) 4.0 - 10.5 K/uL   RBC 4.44  3.87 - 5.11 MIL/uL   Hemoglobin 11.3 (*) 12.0 - 15.0 g/dL   HCT 16.1 (*) 09.6 - 04.5 %   MCV 80.2  78.0 - 100.0 fL   MCH 25.5 (*) 26.0 - 34.0 pg   MCHC 31.7  30.0 - 36.0 g/dL   RDW 40.9 (*) 81.1 - 91.4 %   Platelets 304  150 - 400 K/uL   Neutrophils Relative % 79 (*) 43 - 77 %   Neutro Abs 9.0 (*) 1.7 - 7.7 K/uL   Lymphocytes Relative 10 (*) 12 - 46 %   Lymphs Abs 1.1  0.7 - 4.0 K/uL   Monocytes Relative 7  3 - 12 %   Monocytes Absolute 0.9  0.1 - 1.0 K/uL   Eosinophils Relative 4  0 - 5 %   Eosinophils Absolute 0.4  0.0 - 0.7 K/uL   Basophils Relative 0  0 - 1 %   Basophils Absolute 0.0  0.0 - 0.1 K/uL  COMPREHENSIVE  METABOLIC PANEL      Result Value Range   Sodium 134 (*) 135 - 145 mEq/L   Potassium 4.0  3.5 - 5.1 mEq/L   Chloride 100  96 - 112 mEq/L   CO2 22  19 - 32 mEq/L   Glucose, Bld 144 (*) 70 - 99 mg/dL   BUN 4 (*) 6 - 23 mg/dL   Creatinine, Ser 7.82  0.50 - 1.10 mg/dL   Calcium 9.1  8.4 - 95.6 mg/dL   Total Protein 7.9  6.0 - 8.3 g/dL   Albumin 3.6  3.5 - 5.2 g/dL   AST 19  0 - 37 U/L   ALT 16  0 - 35 U/L   Alkaline Phosphatase 73  39 - 117 U/L   Total Bilirubin 0.3  0.3 - 1.2 mg/dL   GFR calc non Af Amer >90  >90 mL/min   GFR calc Af Amer >90  >90 mL/min  LIPASE, BLOOD      Result Value Range   Lipase 15  11 - 59 U/L  URINALYSIS, ROUTINE W REFLEX MICROSCOPIC      Result Value Range   Color, Urine YELLOW  YELLOW   APPearance CLEAR  CLEAR   Specific Gravity, Urine 1.014  1.005 - 1.030   pH 6.0  5.0 - 8.0   Glucose, UA NEGATIVE  NEGATIVE mg/dL   Hgb urine dipstick NEGATIVE  NEGATIVE   Bilirubin Urine NEGATIVE  NEGATIVE   Ketones, ur NEGATIVE  NEGATIVE mg/dL   Protein, ur NEGATIVE  NEGATIVE mg/dL   Urobilinogen, UA 0.2  0.0 - 1.0 mg/dL   Nitrite NEGATIVE  NEGATIVE   Leukocytes, UA NEGATIVE  NEGATIVE  PREGNANCY, URINE      Result Value Range   Preg Test, Ur NEGATIVE  NEGATIVE  D-DIMER, QUANTITATIVE  Result Value Range   D-Dimer, Quant 1.29 (*) 0.00 - 0.48 ug/mL-FEU  TROPONIN I      Result Value Range   Troponin I <0.30  <0.30 ng/mL    MDM    Julie Munoz is a 37 y.o. female with a PMH of asthma and allergies who presents to the ED for evaluation of URI.    Rechecks  7:30 AM = Patient states she feels mildly better after Duoneb.  Still SOB.  Pulse ox 94%.  Will re-order Duoneb and give Prednisone.   8:45 AM = Patient's condition not improved.  Inspiratory and expiratory wheezing throughout.  Will order a third Duoneb.  Pulse ox 97% on room air.  HR 98.   9:35 AM = Patient states she feels a "little better."  Pulse ox 96%.  HR 110.  Wheezing not improved.   Patient still having dyspnea.  States she walked to the bathroom and felt extremely SOB.  Patient coughing.      10:15 AM = Spoke with patient about admission.  She is in agreement with admission and plan.  She does not have a PCP.   11:30 AM = Spoke with patient about discharge.  Will continue to give antibiotics and then discharge home.  Patient in agreement.   12:23 PM = Patient complaining of post-op abdominal pain.  Ordering her regular scheduled Percocet which she has been taking as needed.   2:30 PM = Patient states she feels much better.  Ready for discharge.     Consults  10:50 AM = Spoke with Dr. Dorene Sorrow who states she does not meet Curb 65 criteria.  He does not feel she meets admission at this time.  He will consult on the patient.     Patient evaluated in the ED for multiple complaints including sore throat, rhinorrhea, nasal congestion, fatigue, shortness of breath, chest pain, cough, dyspnea, fever, abdominal pain, and diarrhea.  Patient likely has an asthma exacerbation vs healthcare associated pneumonia from recent TAH.  Chest CT performed due to elevated d-dimer, which showed a ground glass opacity.  CT ordered due to elevated d-dimer (risk factors for PE included recent surgery with TAH, tachypnea, and SOB).  CT negative for pulmonary embolism.  Patient not hypoxic.  She was given Vanco and Zosyn in the ED and discharged on Levaquin. Patient did not meet admission criteria via hospitalist who recommended discharge.  Troponin negative.  EKG with no acute ischemic changes.  Patient also complained of abdominal pain which has been having since her surgery with mild increase in pain due to coughing.  Abdominal exam benign.  Labs and urine unremarkable.  Patient was instructed to follow-up with her OB/GYN and PCP.  Return precautions were discussed.  Patient informed of incidental pulmonary nodules on CT scan and recommended to follow-up regarding this.  Patient in agreement with discharge and  plan.      Discharge Medication List as of 02/02/2013  2:02 PM    START taking these medications   Details  albuterol (PROVENTIL HFA;VENTOLIN HFA) 108 (90 BASE) MCG/ACT inhaler Inhale 2 puffs into the lungs every 4 (four) hours as needed for wheezing or shortness of breath., Starting 02/02/2013, Until Discontinued, Print    levofloxacin (LEVAQUIN) 750 MG tablet Take 1 tablet (750 mg total) by mouth daily., Starting 02/02/2013, Until Discontinued, Print    predniSONE (DELTASONE) 20 MG tablet Take 2 tablets (40 mg total) by mouth daily., Starting 02/02/2013, Until Discontinued, Print  Final impressions: 1. Healthcare-associated pneumonia   2. Post-op pain      Luiz Iron PA-C   This patient was discussed with Dr. Randye Lobo, PA-C 02/03/13 2231

## 2013-02-02 NOTE — Progress Notes (Signed)
ANTIBIOTIC CONSULT NOTE - INITIAL  Pharmacy Consult for Zosyn and vancomycin Indication: possible HCAP  Allergies  Allergen Reactions  . Shellfish Allergy Anaphylaxis  . Aspirin Other (See Comments)    Allergy unknown Pt can take ibuprofen without problems    Patient Measurements:   On 01/09/13: Wt 122.5 kg Ht 171 cm  Vital Signs: Temp: 98.3 F (36.8 C) (11/28 0631) BP: 116/63 mmHg (11/28 0734) Pulse Rate: 82 (11/28 0734) Intake/Output from previous day:   Intake/Output from this shift:    Labs:  Recent Labs  02/02/13 0710  WBC 11.4*  HGB 11.3*  PLT 304  CREATININE 0.64   Estmated CrCl > 100 mL/min/72kg     Microbiology: Recent Results (from the past 720 hour(s))  RAPID STREP SCREEN     Status: None   Collection Time    02/02/13  6:45 AM      Result Value Range Status   Streptococcus, Group A Screen (Direct) NEGATIVE  NEGATIVE Final   Comment: (NOTE)     A Rapid Antigen test may result negative if the antigen level in the     sample is below the detection level of this test. The FDA has not     cleared this test as a stand-alone test therefore the rapid antigen     negative result has reflexed to a Group A Strep culture.    Medical History: Past Medical History  Diagnosis Date  . Asthma     no inhaler -no problem as adult  . Environmental allergies     Medications:  Scheduled:   Infusions:  . piperacillin-tazobactam    . [START ON 02/03/2013] vancomycin    . vancomycin     PRN:   Assessment: 37 y/o F s/p vag hysterectomy, bilateral salpingectomy 11/13 at [facility], discharged 11/16, presented to ED today with reports of fever, breathing difficulty, nasal congestion, sore throat.  CT chest shows ground glass opacity in RML consistent with infection vs atelectasis.  To begin empiric Zosyn plus vancomycin for possible HCAP.   Goal of Therapy:  Eradication of infection Dose Zosyn for renal function Vancomycin trough 15-20  Plan:  1. Zosyn 3.375  grams IV stat over 30 minutes x 1, then 3.375 grams IV q8h by extended infusion (each dose over 4 hours). 2. Vancomycin 2000 mg IV x 1, then 1250 mg IV q12h. 3. Follow serum creatinine and clinical course. 4. Check vancomycin trough at steady-state.  Elie Goody, PharmD, BCPS Pager: [phone] 02/02/2013  10:30 AM     Anzleigh Slaven, [place] Barban 02/02/2013,10:23 AM

## 2013-02-02 NOTE — ED Notes (Signed)
Hospitalist at bedside 

## 2013-02-02 NOTE — ED Notes (Signed)
Pt c/o feeling like cant breath good; cold symptoms x 2 days; nasal congestion; sore throat; fever

## 2013-02-02 NOTE — ED Notes (Signed)
PA at bedside.

## 2013-02-02 NOTE — ED Notes (Signed)
Pt ambulated independently to restroom

## 2013-02-02 NOTE — Consult Note (Signed)
[facility] Medical Consultation  Julie Munoz:[id number] DOB: 06/23/1975 DOA: 02/02/2013 PCP: No PCP Per Patient   Requesting physician: Dr. Freida Busman Date of consultation: 02/02/13 Reason for consultation: Difficulty breathing  Impression/Recommendations Active Problems: CAP vs Atelectasis Pulmonary nodule  1. CAP vs atelectasis - CURB 65 score 0.  Patient breathing comfortably on room oxygen saturation 99% on my evaluation - Have discussed proper use of albuterol once transitioned home - Recommend Levaquin dose of 750 mg by mouth daily for the next 5 days - Counseled patient to avoid smoke, cigarettes, any allergens could exacerbate her shortness of breath. - Discussed with patient and she verbalizes agreement and understanding. - Would also recommend prednisone 50 mg by mouth daily for the next 4 days  2. pulmonary nodule - Given history of tobacco use would recommend repeat CT in no one-year as recommended by radiologist.    Chief Complaint: Shortness of breath  HPI:  37 year old who presented with two-day complaint of shortness of breath. Given persistence in symptoms and that it was progressively getting worse patient decided to come to the ED for further evaluation. It is associated with wheeze per patient. Patient states that she had a history of asthma but is currently not on any bronchodilator therapy at home.  Was seen in the emergency department and given nebulizer treatments as well as prednisone and IV antibiotics vancomycin and Zosyn.  Troponin was negative, d-dimer was elevated and subsequently chest CT angiogram was performed. Did not identify any new pulmonary embolism did however report 2 left pulmonary nodules measuring up to 4 mm. Given shortness of breath and wheezing we were consult for further recommendations.  Review of Systems:  10 point review of systems negative unless otherwise mentioned above  Past Medical History  Diagnosis Date  .  Asthma     no inhaler -no problem as adult  . Environmental allergies    Past Surgical History  Procedure Laterality Date  . Cesarean section    . Corneal transplant      surgeries x 3, bilateral left x 2, right x 1  . Cholecystectomy    . Laparoscopy N/A 08/08/2012    Procedure: LAPAROSCOPY OPERATIVE;  Surgeon: Lavina Hamman, MD;  Location: [facility];  Service: Gynecology;  Laterality: N/A;  . Laparoscopic assisted vaginal hysterectomy N/A 01/18/2013    Procedure: LAPAROSCOPIC ASSISTED VAGINAL HYSTERECTOMY;  Surgeon: Lavina Hamman, MD;  Location: [facility];  Service: Gynecology;  Laterality: N/A;  . Bilateral salpingectomy Bilateral 01/18/2013    Procedure: BILATERAL SALPINGECTOMY;  Surgeon: Lavina Hamman, MD;  Location: [facility];  Service: Gynecology;  Laterality: Bilateral;  . Abdominal hysterectomy     Social History:  reports that she quit smoking about 2 months ago. Her smoking use included Cigarettes. She has a 5 pack-year smoking history. She has never used smokeless tobacco. She reports that she drinks alcohol. She reports that she does not use illicit drugs.  Allergies  Allergen Reactions  . Shellfish Allergy Anaphylaxis  . Aspirin Other (See Comments)    Allergy unknown Pt can take ibuprofen without problems  . Other     NO BLOOD PRODUCTS - PT IS OF [employer] FAITH   Family History  Problem Relation Age of Onset  . Asthma Mother     Prior to Admission medications   Medication Sig Start Date End Date Taking? Authorizing Provider  Astragalus Extract POWD Take 1 capsule by mouth every morning.   Yes Historical Provider, MD  ferrous sulfate 325 (65  FE) MG tablet Take 325 mg by mouth daily with breakfast.   Yes Historical Provider, MD  oxyCODONE-acetaminophen (PERCOCET/ROXICET) 5-325 MG per tablet Take 1-2 tablets by mouth every 4 (four) hours as needed for severe pain (moderate to severe pain (when tolerating fluids)). 01/19/13  Yes Lavina Hamman, MD   Phenylephrine-DM-GG-APAP (MUCINEX FAST-MAX COLD FLU PO) Take 20 mLs by mouth 2 (two) times daily as needed (cold/cough).   Yes Historical Provider, MD  vitamin C (ASCORBIC ACID) 500 MG tablet Take 1,000 mg by mouth every morning.   Yes Historical Provider, MD  vitamin E 400 UNIT capsule Take 400 Units by mouth every morning.   Yes Historical Provider, MD  sulfamethoxazole-trimethoprim (BACTRIM DS) 800-160 MG per tablet Take 1 tablet by mouth 2 (two) times daily. 01/25/13   Historical Provider, MD   Physical Exam: Blood pressure 116/63, pulse 82, temperature 98.3 F (36.8 C), resp. rate 25, last menstrual period 12/26/2012, SpO2 95.00%. Filed Vitals:   02/02/13 0734  BP: 116/63  Pulse: 82  Temp:   Resp: 25     General:  Pt in NAD, Alert and oriented x 3  Eyes: EOMI, non icteric  ENT: normal exterior appearance, MMM  Neck: supple, no goiter  Cardiovascular: RRR, no MRG  Respiratory: expiratory wheeze mild, equal chest rise  Abdomen: soft, NT, ND  Skin: warm and dry  Musculoskeletal: no cyanosis or clubbing  Psychiatric: mood and affect appropriate  Neurologic: answers questions appropriately, moves all extremities equally  Labs on Admission:  Basic Metabolic Panel:  Recent Labs Lab 02/02/13 0710  NA 134*  K 4.0  CL 100  CO2 22  GLUCOSE 144*  BUN 4*  CREATININE 0.64  CALCIUM 9.1   Liver Function Tests:  Recent Labs Lab 02/02/13 0710  AST 19  ALT 16  ALKPHOS 73  BILITOT 0.3  PROT 7.9  ALBUMIN 3.6    Recent Labs Lab 02/02/13 0710  LIPASE 15   No results found for this basename: AMMONIA,  in the last 168 hours CBC:  Recent Labs Lab 02/02/13 0710  WBC 11.4*  NEUTROABS 9.0*  HGB 11.3*  HCT 35.6*  MCV 80.2  PLT 304   Cardiac Enzymes:  Recent Labs Lab 02/02/13 0710  TROPONINI <0.30   BNP: No components found with this basename: POCBNP,  CBG: No results found for this basename: GLUCAP,  in the last 168 hours  Radiological Exams  on Admission: Dg Chest 2 View  02/02/2013   CLINICAL DATA:  Shortness of breath  EXAM: CHEST  2 VIEW  COMPARISON:  None.  FINDINGS: The heart size and mediastinal contours are within normal limits. Both lungs are clear. The visualized skeletal structures are unremarkable.  IMPRESSION: No active cardiopulmonary disease.   Electronically Signed   By: Alcide Clever M.D.   On: 02/02/2013 07:21   Ct Angio Chest Pe W/cm &/or Wo Cm  02/02/2013   CLINICAL DATA:  Shortness of breath.  Mid chest pain.  EXAM: CT ANGIOGRAPHY CHEST WITH CONTRAST  TECHNIQUE: Multidetector CT imaging of the chest was performed using the standard protocol during bolus administration of intravenous contrast. Multiplanar CT image reconstructions including MIPs were obtained to evaluate the vascular anatomy.  CONTRAST:  OMNIPAQUE IOHEXOL 350 MG/ML SOLN  COMPARISON:  Chest x-ray of the same date and 12/31/2009  FINDINGS: No evidence of pulmonary embolism.  No pericardial effusion. Thoracic aorta is normal in caliber. Visualized thyroid is unremarkable.  No pathologically enlarged hilar, mediastinal, or axillary lymph nodes  are seen. Airways are patent. Ground-glass opacity is noted in the right middle lobe, representing infection versus atelectasis. There are three 4 mm pulmonary nodules, including one in the right middle lobe (image 24, series 10) and two nodules along the left major fissure (images 49 and 47, series 10).  Visualized upper abdomen is unremarkable. No evidence of acute osseous abnormality.  Review of the MIP images confirms the above findings.  IMPRESSION: 1. No evidence of pulmonary embolism. 2. Ground-glass opacity in the right middle lobe, representing infection versus atelectasis. 3. One right and two left pulmonary nodules, all measuring up to 4 mm. If the patient is at high risk for bronchogenic carcinoma, follow-up chest CT at 1year is recommended. If the patient is at low risk, no follow-up is needed. This  recommendation follows the consensus statement: Guidelines for Management of Small Pulmonary Nodules Detected on CT Scans: A Statement from the Fleischner Society as published in Radiology 2005; 237:395-400.   Electronically Signed   By: Jerene Dilling M.D.   On: 02/02/2013 09:40    Time spent: > 35 minutes  Penny Pia [facility] Pager [phone]  If 7PM-7AM, please contact night-coverage [email or web address] Password [facility] 02/02/2013, 10:59 AM

## 2013-02-02 NOTE — ED Provider Notes (Signed)
Medical screening examination/treatment/procedure(s) were performed by non-physician practitioner and as supervising physician I was immediately available for consultation/collaboration.  EKG Interpretation    Date/Time:  Friday February 02 2013 14:23:38 EST Ventricular Rate:  89 PR Interval:  189 QRS Duration: 78 QT Interval:  388 QTC Calculation: 472 R Axis:   40 Text Interpretation:  Sinus rhythm Borderline T wave abnormalities Baseline wander in lead(s) V2 Confirmed by Freida Busman  MD, Merel Santoli ([id number]) on 02/02/2013 2:29:40 PM             Toy Baker, MD 02/02/13 1429

## 2013-02-02 NOTE — Discharge Instructions (Signed)
Followup with her OB/GYN regarding your abdominal pain Continued to take her oxycodone as needed for abdominal pain Return to the emergency department if you have any worsening or changing abdominal pain, repeated vomiting, blood in your stool or vomit, feeling lightheaded or dizzy, or fever Take antibiotics for pneumonia  Followup with her primary care provider regarding your cough and chest pain Take 2 puffs of your albuterol inhaler every 4-6 hours Take prednisone for the next 5 days  Drink fluids and get plenty of rest Return to the emergency room if you have any breathing not relieved by your inhalers, coughing up blood, change or worsening chest pain or any other concerns Please read below for further discharge instructions and return precautions Follow-up with your doctor regarding your pulmonary nodules - you may require a repeat CT scan     Pneumonia, Adult Pneumonia is an infection of the lungs.  CAUSES Pneumonia may be caused by bacteria or a virus. Usually, these infections are caused by breathing infectious particles into the lungs (respiratory tract). SYMPTOMS   Cough.  Fever.  Chest pain.  Increased rate of breathing.  Wheezing.  Mucus production. DIAGNOSIS  If you have the common symptoms of pneumonia, your caregiver will typically confirm the diagnosis with a chest X-ray. The X-ray will show an abnormality in the lung (pulmonary infiltrate) if you have pneumonia. Other tests of your blood, urine, or sputum may be done to find the specific cause of your pneumonia. Your caregiver may also do tests (blood gases or pulse oximetry) to see how well your lungs are working. TREATMENT  Some forms of pneumonia may be spread to other people when you cough or sneeze. You may be asked to wear a mask before and during your exam. Pneumonia that is caused by bacteria is treated with antibiotic medicine. Pneumonia that is caused by the influenza virus may be treated with an  antiviral medicine. Most other viral infections must run their course. These infections will not respond to antibiotics.  PREVENTION A pneumococcal shot (vaccine) is available to prevent a common bacterial cause of pneumonia. This is usually suggested for:  People over 36 years old.  Patients on chemotherapy.  People with chronic lung problems, such as bronchitis or emphysema.  People with immune system problems. If you are over 65 or have a high risk condition, you may receive the pneumococcal vaccine if you have not received it before. In some countries, a routine influenza vaccine is also recommended. This vaccine can help prevent some cases of pneumonia.You may be offered the influenza vaccine as part of your care. If you smoke, it is time to quit. You may receive instructions on how to stop smoking. Your caregiver can provide medicines and counseling to help you quit. HOME CARE INSTRUCTIONS   Cough suppressants may be used if you are losing too much rest. However, coughing protects you by clearing your lungs. You should avoid using cough suppressants if you can.  Your caregiver may have prescribed medicine if he or she thinks your pneumonia is caused by a bacteria or influenza. Finish your medicine even if you start to feel better.  Your caregiver may also prescribe an expectorant. This loosens the mucus to be coughed up.  Only take over-the-counter or prescription medicines for pain, discomfort, or fever as directed by your caregiver.  Do not smoke. Smoking is a common cause of bronchitis and can contribute to pneumonia. If you are a smoker and continue to smoke, your cough may  last several weeks after your pneumonia has cleared.  A cold steam vaporizer or humidifier in your room or home may help loosen mucus.  Coughing is often worse at night. Sleeping in a semi-upright position in a recliner or using a couple pillows under your head will help with this.  Get rest as you feel it  is needed. Your body will usually let you know when you need to rest. SEEK IMMEDIATE MEDICAL CARE IF:   Your illness becomes worse. This is especially true if you are elderly or weakened from any other disease.  You cannot control your cough with suppressants and are losing sleep.  You begin coughing up blood.  You develop pain which is getting worse or is uncontrolled with medicines.  You have a fever.  Any of the symptoms which initially brought you in for treatment are getting worse rather than better.  You develop shortness of breath or chest pain. MAKE SURE YOU:   Understand these instructions.  Will watch your condition.  Will get help right away if you are not doing well or get worse. Document Released: 02/22/2005 Document Revised: 05/17/2011 Document Reviewed: 05/14/2010 [facility] Patient Information 2014 [place], [place].  Asthma, Adult Asthma is a recurring condition in which the airways tighten and narrow. Asthma can make it difficult to breathe. It can cause coughing, wheezing, and shortness of breath. Asthma episodes (also called asthma attacks) range from minor to life-threatening. Asthma cannot be cured, but medicines and lifestyle changes can help control it. CAUSES Asthma is believed to be caused by inherited (genetic) and environmental factors, but its exact cause is unknown. Asthma may be triggered by allergens, lung infections, or irritants in the air. Asthma triggers are different for each person. Common triggers include:   Animal dander.  Dust mites.  Cockroaches.  Pollen from trees or grass.  Mold.  Smoke.  Air pollutants such as dust, household cleaners, hair sprays, aerosol sprays, paint fumes, strong chemicals, or strong odors.  Cold air, weather changes, and winds (which increase molds and pollens in the air).  Strong emotional expressions such as crying or laughing hard.  Stress.  Certain medicines (such as aspirin) or types of drugs (such  as beta-blockers).  Sulfites in foods and drinks. Foods and drinks that may contain sulfites include dried fruit, potato chips, and sparkling grape juice.  Infections or inflammatory conditions such as the flu, a cold, or an inflammation of the nasal membranes (rhinitis).  Gastroesophageal reflux disease (GERD).  Exercise or strenuous activity. SYMPTOMS Symptoms may occur immediately after asthma is triggered or many hours later. Symptoms include:  Wheezing.  Excessive nighttime or early morning coughing.  Frequent or severe coughing with a common cold.  Chest tightness.  Shortness of breath. DIAGNOSIS  The diagnosis of asthma is made by a review of your medical history and a physical exam. Tests may also be performed. These may include:  Lung function studies. These tests show how much air you breath in and out.  Allergy tests.  Imaging tests such as X-rays. TREATMENT  Asthma cannot be cured, but it can usually be controlled. Treatment involves identifying and avoiding your asthma triggers. It also involves medicines. There are 2 classes of medicine used for asthma treatment:   Controller medicines. These prevent asthma symptoms from occurring. They are usually taken every day.  Reliever or rescue medicines. These quickly relieve asthma symptoms. They are used as needed and provide short-term relief. Your health care provider will help you create an  asthma action plan. An asthma action plan is a written plan for managing and treating your asthma attacks. It includes a list of your asthma triggers and how they may be avoided. It also includes information on when medicines should be taken and when their dosage should be changed. An action plan may also involve the use of a device called a peak flow meter. A peak flow meter measures how well the lungs are working. It helps you monitor your condition. HOME CARE INSTRUCTIONS   Take medicine as directed by your health care provider.  Speak with your health care provider if you have questions about how or when to take the medicines.  Use a peak flow meter as directed by your health care provider. Record and keep track of readings.  Understand and use the action plan to help minimize or stop an asthma attack without needing to seek medical care.  Control your home environment in the following ways to help prevent asthma attacks:  Do not smoke. Avoid being exposed to secondhand smoke.  Change your heating and air conditioning filter regularly.  Limit your use of fireplaces and wood stoves.  Get rid of pests (such as roaches and mice) and their droppings.  Throw away plants if you see mold on them.  Clean your floors and dust regularly. Use unscented cleaning products.  Try to have someone else vacuum for you regularly. Stay out of rooms while they are being vacuumed and for a short while afterward. If you vacuum, use a dust mask from a hardware store, a double-layered or microfilter vacuum cleaner bag, or a vacuum cleaner with a HEPA filter.  Replace carpet with wood, tile, or vinyl flooring. Carpet can trap dander and dust.  Use allergy-proof pillows, mattress covers, and box spring covers.  Wash bed sheets and blankets every week in hot water and dry them in a dryer.  Use blankets that are made of polyester or cotton.  Clean bathrooms and kitchens with bleach. If possible, have someone repaint the walls in these rooms with mold-resistant paint. Keep out of the rooms that are being cleaned and painted.  Wash hands frequently. SEEK MEDICAL CARE IF:   You have wheezing, shortness of breath, or a cough even if taking medicine to prevent attacks.  The colored mucus you cough up (sputum) is thicker than usual.  Your sputum changes from clear or white to yellow, green, gray, or bloody.  You have any problems that may be related to the medicines you are taking (such as a rash, itching, swelling, or trouble  breathing).  You are using a reliever medicine more than 2 3 times per week.  Your peak flow is still at 50 79% of you personal best after following your action plan for 1 hour. SEEK IMMEDIATE MEDICAL CARE IF:   You seem to be getting worse and are unresponsive to treatment during an asthma attack.  You are short of breath even at rest.  You get short of breath when doing very little physical activity.  You have difficulty eating, drinking, or talking due to asthma symptoms.  You develop chest pain.  You develop a fast heartbeat.  You have a bluish color to your lips or fingernails.  You are lightheaded, dizzy, or faint.  Your peak flow is less than 50% of your personal best.  You have a fever or persistent symptoms for more than 2 3 days.  You have a fever and symptoms suddenly get worse. MAKE SURE YOU:  Understand these instructions.  Will watch your condition.  Will get help right away if you are not doing well or get worse. Document Released: 02/22/2005 Document Revised: 10/25/2012 Document Reviewed: 09/21/2012 [facility] Patient Information 2014 [place], [place].  Bronchospasm, Adult A bronchospasm is when the tubes that carry air in and out of your lungs (airwarys) spasm or tighten. During a bronchospasm it is hard to breathe. This is because the airways get smaller. A bronchospasm can be triggered by:  Allergies. These may be to animals, pollen, food, or mold.  Infection. This is a common cause of bronchospasm.  Exercise.  Irritants. These include pollution, cigarette smoke, strong odors, aerosol sprays, and paint fumes.  Weather changes.  Stress.  Being emotional. HOME CARE   Always have a plan for getting help. Know when to call your doctor and local emergency services (911 in the [place] Know where you can get emergency care.  Only take medicines as told by your doctor.  If you were prescribed an inhaler or nebulizer machine, ask your doctor how to  use it correctly. Always use a spacer with your inhaler if you were given one.  Stay calm during an attack. Try to relax and breathe more slowly.  Control your home environment:  Change your heating and air conditioning filter at least once a month.  Limit your use of fireplaces and wood stoves.  Do not  smoke. Do not  allow smoking in your home.  Avoid perfumes and fragrances.  Get rid of pests (such as roaches and mice) and their droppings.  Throw away plants if you see mold on them.  Keep your house clean and dust free.  Replace carpet with wood, tile, or vinyl flooring. Carpet can trap dander and dust.  Use allergy-proof pillows, mattress covers, and box spring covers.  Wash bed sheets and blankets every week in hot water. Dry them in a dryer.  Use blankets that are made of polyester or cotton.  Wash hands frequently. GET HELP IF:  You have muscle aches.  You have chest pain.  The thick spit you spit or cough up (sputum) changes from clear or white to yellow, green, gray, or bloody.  The thick spit you spit or cough up gets thicker.  There are problems that may be related to the medicine you are given such as:  A rash.  Itching.  Swelling.  Trouble breathing. GET HELP RIGHT AWAY IF:  You feel you cannot breathe or catch your breath.  You cannot stop coughing.  Your treatment is not helping you breathe better. MAKE SURE YOU:   Understand these instructions.  Will watch your condition.  Will get help right away if you are not doing well or get worse. Document Released: 12/20/2008 Document Revised: 10/25/2012 Document Reviewed: 08/15/2012 [facility] Patient Information 2014 [place], [place].  Pain Relief Preoperatively and Postoperatively Being a good patient does not mean being a silent one.If you have questions, problems, or concerns about the pain you may feel after surgery, let your caregiver know.Patients have the right to assessment and  management of pain. The treatment of pain after surgery is important to speed up recovery and return to normal activities. Severe pain after surgery, and the fear or anxiety associated with that pain, may cause extreme discomfort that: Prevents sleep. Decreases the ability to breathe deeply and cough. This can cause pneumonia or other upper airway infections. Causes your heart to beat faster and your blood pressure to be higher. Increases the risk for  constipation and bloating. Decreases the ability of wounds to heal. May result in depression, increased anxiety, and feelings of helplessness. Relief of pain before surgery is also important because it will lessen the pain after surgery. Patients who receive both pain relief before and after surgery experience greater pain relief than those who only receive pain relief after surgery. Let your caregiver know if you are having uncontrolled pain.This is very important.Pain after surgery is more difficult to manage if it is permitted to become severe, so prompt and adequate treatment of acute pain is necessary. PAIN CONTROL METHODS Your caregivers follow policies and procedures about the management of patient pain.These guidelines should be explained to you before surgery.Plans for pain control after surgery must be mutually decided upon and instituted with your full understanding and agreement.Do not be afraid to ask questions regarding the care you are receiving.There are many different ways your caregivers will attempt to control your pain, including the following methods. As needed pain control You may be given pain medicine either through your intravenous (IV) tube, or as a pill or liquid you can swallow. You will need to let your caregiver know when you are having pain. Then, your caregiver will give you the pain medicine ordered for you. Your pain medicine may make you constipated. If constipation occurs, drink more liquids if you can. Your  caregiver may have you take a mild laxative. IV patient-controlled analgesia pump (PCA pump) You can get your pain medicine through the IV tube which goes into your vein. You are able to control the amount of pain medicine that you get. The pain medicine flows in through an IV tube and is controlled by a pump. This pump gives you a set amount of pain medicine when you push the button hooked up to it. Nobody should push this button but you or someone specifically assigned by you to do so. It is set up to keep you from accidentally giving yourself too much pain medicine. You will be able to start using your pain pump in the recovery room after your surgery. This method can be helpful for most types of surgery. If you are still having too much pain, tell your caregiver. Also, tell your caregiver if you are feeling too sleepy or nauseous. Continuous epidural pain control A thin, soft tube (catheter) is put into your back. Pain medicine flows through the catheter to lessen pain in the part of your body where the surgery is done. Continuous epidural pain control may work best for you if you are having surgery on your chest, abdomen, hip area, or legs. The epidural catheter is usually put into your back just before surgery. The catheter is left in until you can eat and take medicine by mouth. In most cases, this may take 2 to 3 days. Giving pain medicine through the epidural catheter may help you heal faster because: Your bowel gets back to normal faster. You can get back to eating sooner. You can be up and walking sooner. Medicine that numbs the area (local anesthetic) You may receive an injection of pain medicine near where the pain is (local infiltration). You may receive an injection of pain medicine near the nerve that controls the sensation to a specific part of the body (peripheral nerve block). Medicine may be put in the spine to block pain (spinal block). Opioids Moderate to moderately severe acute  pain after surgery may respond to opioids.Opioids are narcotic pain medicine. Opioids are often combined with non-narcotic medicines  to improve pain relief, diminish the risk of side effects, and reduce the chance of addiction. If you follow your caregiver's directions about taking opioids and you do not have a history of substance abuse, your risk of becoming addicted is exceptionally small.Opioids are given for short periods of time in careful doses to prevent addiction. Other methods of pain control include: Steroids. Physical therapy. Heat and cold therapy. Compression, such as wrapping an elastic bandage around the area of pain. Massage. These various ways of controlling pain may be used together. Combining different methods of pain control is called multimodal analgesia. Using this approach has many benefits, including being able to eat, move around, and leave the hospital sooner. Document Released: 05/15/2002 Document Revised: 05/17/2011 Document Reviewed: 05/19/2010 [facility] Patient Information 2014 [place], [place].  Abdominal Pain Abdominal pain can be caused by many things. Your caregiver decides the seriousness of your pain by an examination and possibly blood tests and X-rays. Many cases can be observed and treated at home. Most abdominal pain is not caused by a disease and will probably improve without treatment. However, in many cases, more time must pass before a clear cause of the pain can be found. Before that point, it may not be known if you need more testing, or if hospitalization or surgery is needed. HOME CARE INSTRUCTIONS   Do not take laxatives unless directed by your caregiver.  Take pain medicine only as directed by your caregiver.  Only take over-the-counter or prescription medicines for pain, discomfort, or fever as directed by your caregiver.  Try a clear liquid diet (broth, tea, or water) for as long as directed by your caregiver. Slowly move to a bland diet as  tolerated. SEEK IMMEDIATE MEDICAL CARE IF:   The pain does not go away.  You have a fever.  You keep throwing up (vomiting).  The pain is felt only in portions of the abdomen. Pain in the right side could possibly be appendicitis. In an adult, pain in the left lower portion of the abdomen could be colitis or diverticulitis.  You pass bloody or black tarry stools. MAKE SURE YOU:   Understand these instructions.  Will watch your condition.  Will get help right away if you are not doing well or get worse. Document Released: 12/02/2004 Document Revised: 05/17/2011 Document Reviewed: 10/11/2007 [facility] Patient Information 2014 [place], [place].  RESOURCE GUIDE  Chronic Pain Problems: Contact [facility]  [phone] Patients need to be referred by their primary care doctor.  Insufficient Money for Medicine: Contact [employer]:  call [phone]  No Primary Care Doctor: - Call [facility]  [phone] - can help you locate a primary care doctor that  accepts your insurance, provides certain services, etc. - Physician Referral [phone]  Agencies that provide inexpensive medical care: - [facility]  [phone] - [facility]  [phone] - [facility]  [phone] - [facility]  [phone] - [facility]  [phone] [facility]  [phone]  Medicaid-accepting [facility] Providers: - [facility]- [address]  [phone], Mon-Fri 9am-7pm, Sat 9am-1pm - [facility]- [address] [place], [address] [place]  [phone] - [facility]- [address], [address] [place]  [phone] [facility]- [address]  [phone] - Renaye Rakers- [address] [place], [address], [phone]  Only accepts [place] patients after they have their name  applied to their card  Self Pay (no insurance) in [facility]: - Sickle Cell Patients - [facility]  [address] [place], [phone] - [facility]- [address] [place]  [phone]       Patrcia Dolly [facility] [place]- [address]       -     [facility]- see information above (Speak to [employer] if you do not have insurance)       -  [facility]- [address],  [phone]       -  [facility]- [address] [phone]       -  Dr Julio Sicks-  [address], [place], [phone]       -  [facility] - [address] [phone]       -  [facility]- [address], [phone], also [address] [phone]       -     [facility]- [address] [place], [phone], 1st & 3rd Saturday         every month, 10am-1pm  -     [facility]   [address] [place], [place].   Phone:  [phone], Fax:  [phone]. Hours of Operation:  9 am - 6 pm, M-F.  -     [facility]   [address], [place]   Phone: [phone], Fax: [phone]. Hours of Operation:  8:30 am - 5:30 pm, M-F.  [facility] [address] [place], [place] [address] [phone]  [facility] [address] [place], [place] [address] [phone]  1) Find a Doctor and Pay Out of Pocket Although you won't have to find out who is covered by your insurance plan, it is a good idea to ask around and get recommendations. You will then need to call the office and see if the doctor you have chosen will accept you as a new patient and what types of options they offer for patients who are self-pay. Some doctors offer discounts or will set up payment plans for their patients who do not have insurance, but you will need to ask so you aren't surprised when you get to your appointment.  2) Contact Your Local Health Department Not all health departments  have doctors that can see patients for sick visits, but many do, so it is worth a call to see if yours does. If you don't know where your local health department is, you can check in your phone book. The CDC also has a tool to help you locate your state's health department, and many state websites also have listings of all of their local health departments.  3) Find a Walk-in Clinic If your illness is not likely to be very severe or complicated, you may want to try a walk in clinic. These are popping up all over the country in pharmacies, drugstores, and shopping centers. They're usually staffed by nurse practitioners or physician assistants that have been trained to treat common illnesses and complaints. They're usually fairly quick and inexpensive. However, if you have serious medical issues or chronic medical problems, these are probably not your best option  STD Testing - [facility] Department of [facility] [place], STD Clinic, [address]  [place], phone [phone] or [phone].  Monday - Friday, call for an appointment. [facility] Department of [employer], STD Clinic, [place] [address], [place], phone [phone] or [phone].  Monday - Friday, call for an appointment.  Abuse/Neglect: [facility] Child Abuse Hotline 813-139-5664 [facility] Child Abuse Hotline 212-474-4220 (After Hours)  Emergency Shelter:  [facility] [phone]  Maternity Homes: - [facility] [phone] - [facility] [phone]  MRSA Hotline #:   (450) 880-8833  Dental Assistance If unable to pay or uninsured, contact:  [facility]. to become qualified for the adult dental clinic.  Patients with Medicaid: [facility] [phone] W. Joellyn Quails, [phone] [address] [phone]  If unable to pay, or uninsured, contact [facility]  [phone] in [place], [phone] in [facility]) to become qualified for the adult dental clinic  [facility] [address] [place], [place] [address] [phone] [email or web address]  Other Proofreader Services: - [facility]- [address] [place], [place], [place], [address], [phone], Ext. [phone], 2nd and 4th Thursday of the month at 6:30am.  10 clients each day by appointment, can sometimes see walk-in patients if someone does not show for an appointment. [facility]- [address] Ether Griffins [place], [place], [address], [phone] - [facility] [address], [place], [place], [address], [phone] - [facility]- [phone] [facility] Department- [phone] [facility] [phone]       Behavioral Health Resources in the [facility]  Intensive Outpatient Programs: [facility]      [address] [place], [place] [phone] Both a day and evening program       [facility] Outpatient     [address]        [place], [place] [address] [phone]         ADS: [facility] [address] [place] [phone]  [facility] Mental Health ACCESS LINE: ([phone] or [phone] [address] [place], [place] [address] [email or web address]   Substance Abuse Resources: - [facility] Drug [facility]  ([phone] - [facility] [phone] - [facility] [place] [phone] Floydene Flock [phone] - Residential & Outpatient Substance Abuse Program  [phone]  Psychological Services: [facility]  [phone] [facility]  [phone] - [facility], [phone] [place]. [address] [place], ACCESS LINE: [phone] or [phone], [email or web address]  Mobile Crisis Teams:                                           [facility]         Mobile Crisis Care Unit [phone]             [facility] [address] Ginette Otto [phone]                                         Interventionist [address] [place] [address] [place] [phone]  Self-Help/Support Groups: [facility] of [employer] of support groups [phone] (call for more info)  Narcotics Anonymous (NA) [facility] [address] [place] - 2 meetings at this location  Residential Treatment Programs:  [facility]      [address]        [place]       [phone]         [facility] [address], [place] [address] [place], [place]  [phone]  [facility] Treatment [facility]  [address] [place], [place] [address] [phone] Admissions: 8am-3pm M-F  [facility]     [address]        [place], [place] [address]       [phone]         [facility] [address] [place]ling Manns [place], [place] [phone]  [facility] [address] [place], [place] [phone]  [facility] - Intensive Outpatient      [address]     [place], [place]       [phone]         [facility] ([facility]     [address] [place], [place] [phone] or [phone]  Residential Treatment Services (RTS), Medicaid [address] [place], [place] [phone]  [facility] [address]                                               [address] [place] [phone]  [facility] Resources: [phone]               General Therapy                                                Angie Fava, PhD        [address] [place], [place] [address]         [phone]   Insurance  [facility]   [address] [place], [place] [address] [phone]  [facility] [address] [place], [place] [address] [phone] Insurance/Medicaid/sponsorship through [facility] and Families                                              [address]                                        [place], [place] [address]    Therapy/tele-psych/case         [phone]          [facility] [address] [place]  [address]  Adolescent/group home/case management [phone]  Creola Corn PhD       General therapy       Insurance   [phone]         Dr. Lolly Mustache, Insurance, M-F [phone]  [facility]  [employer] [facility] Dept. [address]                 [address]         [address] [place] [address]  Blondell Reveal Phone:  [phone]                                  Phone:  [phone]                   Phone:  [phone]  [facility] Mental Health, [phone] - [facility] - [facility]- [phone]       -     [facility] in [place], [address]             [phone], Insurance  [place] Child Abuse Hotline 816-858-0122 or [phone] (After Hours)

## 2013-02-02 NOTE — ED Notes (Signed)
Pharmacy calling for prescription instruction clarification.  Call transferred to Rx prescriber Princella Ion PA

## 2013-02-02 NOTE — ED Notes (Signed)
Patient transported to CT 

## 2013-02-03 NOTE — ED Provider Notes (Signed)
Medical screening examination/treatment/procedure(s) were performed by non-physician practitioner and as supervising physician I was immediately available for consultation/collaboration.  EKG Interpretation    Date/Time:  Friday February 02 2013 14:23:38 EST Ventricular Rate:  89 PR Interval:  189 QRS Duration: 78 QT Interval:  388 QTC Calculation: 472 R Axis:   40 Text Interpretation:  Sinus rhythm Borderline T wave abnormalities Baseline wander in lead(s) V2 Confirmed by Freida Busman  MD, Jonaya Freshour ([id number]) on 02/02/2013 2:29:40 PM             Toy Baker, MD 02/03/13 2314

## 2013-02-04 LAB — CULTURE, GROUP A STREP

## 2013-05-03 ENCOUNTER — Emergency Department (HOSPITAL_COMMUNITY)
Admission: EM | Admit: 2013-05-03 | Discharge: 2013-05-03 | Disposition: A | Discharge status: Home / Self Care | Payer: Managed Care, Other (non HMO) | Attending: Emergency Medicine | Admitting: Emergency Medicine

## 2013-05-03 ENCOUNTER — Encounter (HOSPITAL_COMMUNITY): Payer: Self-pay | Admitting: Emergency Medicine

## 2013-05-03 DIAGNOSIS — L0291 Cutaneous abscess, unspecified: Secondary | ICD-10-CM

## 2013-05-03 DIAGNOSIS — Z792 Long term (current) use of antibiotics: Secondary | ICD-10-CM | POA: Insufficient documentation

## 2013-05-03 DIAGNOSIS — Z79899 Other long term (current) drug therapy: Secondary | ICD-10-CM | POA: Insufficient documentation

## 2013-05-03 DIAGNOSIS — Z87891 Personal history of nicotine dependence: Secondary | ICD-10-CM | POA: Insufficient documentation

## 2013-05-03 DIAGNOSIS — IMO0002 Reserved for concepts with insufficient information to code with codable children: Secondary | ICD-10-CM | POA: Insufficient documentation

## 2013-05-03 DIAGNOSIS — J45909 Unspecified asthma, uncomplicated: Secondary | ICD-10-CM | POA: Insufficient documentation

## 2013-05-03 MED ORDER — TRAMADOL HCL 50 MG PO TABS
50.0000 mg | ORAL_TABLET | Freq: Once | ORAL | Status: AC
  Administered 2013-05-03: 50 mg via ORAL
  Filled 2013-05-03: qty 1

## 2013-05-03 MED ORDER — TRAMADOL HCL 50 MG PO TABS: 50.0000 mg | ORAL_TABLET | Freq: Four times a day (QID) | ORAL | Status: DC | PRN

## 2013-05-03 MED ORDER — ACETAMINOPHEN 325 MG PO TABS
650.0000 mg | ORAL_TABLET | Freq: Once | ORAL | Status: AC
  Administered 2013-05-03: 650 mg via ORAL
  Filled 2013-05-03: qty 2

## 2013-05-03 NOTE — Discharge Instructions (Signed)
Take the prescribed medication as directed. Continue applying warm compressed to help aid drainage.  Drainage may continue for another few days, this is normal. Return to the ED for new or worsening symptoms.

## 2013-05-03 NOTE — ED Provider Notes (Signed)
CSN: [id number]     Arrival date & time 05/03/13  1057 History   First MD Initiated Contact with Patient 05/03/13 1103     Chief Complaint  Patient presents with  . Abscess   (Consider location/radiation/quality/duration/timing/severity/associated sxs/prior Treatment) Patient is a 38 y.o. female presenting with abscess. The history is provided by the patient and medical records.  Abscess  This is a 38 year old female with no significant past medical history presenting to the ED for abscess in her right axilla for the past week.  States she's had abscesses before, mostly after shaving under her arms. Unsure if this is abscess or just "ingrown hair."  States she's been applying warm compresses without noted improvement. Unsure of drainage. She endorses subjective fever yesterday. Denies chills.  No prior hx of MRSA.  Afebrile on arrival.  Past Medical History  Diagnosis Date  . Asthma     no inhaler -no problem as adult  . Environmental allergies    Past Surgical History  Procedure Laterality Date  . Cesarean section    . Corneal transplant      surgeries x 3, bilateral left x 2, right x 1  . Cholecystectomy    . Laparoscopy N/A 08/08/2012    Procedure: LAPAROSCOPY OPERATIVE;  Surgeon: Cheri Fowler, MD;  Location: [facility];  Service: Gynecology;  Laterality: N/A;  . Laparoscopic assisted vaginal hysterectomy N/A 01/18/2013    Procedure: LAPAROSCOPIC ASSISTED VAGINAL HYSTERECTOMY;  Surgeon: Cheri Fowler, MD;  Location: [facility];  Service: Gynecology;  Laterality: N/A;  . Bilateral salpingectomy Bilateral 01/18/2013    Procedure: BILATERAL SALPINGECTOMY;  Surgeon: Cheri Fowler, MD;  Location: [facility];  Service: Gynecology;  Laterality: Bilateral;  . Abdominal hysterectomy     Family History  Problem Relation Age of Onset  . Asthma Mother    History  Substance Use Topics  . Smoking status: Former Smoker -- 0.50 packs/day for 10 years    Types: Cigarettes    Quit date: 12/03/2012   . Smokeless tobacco: Never Used     Comment: quit smoking in approx. Sept 2014  . Alcohol Use: Yes     Comment: socially   OB History   Grav Para Term Preterm Abortions TAB SAB Ect Mult Living   1 1        1      Review of Systems  Skin:       Abscess  All other systems reviewed and are negative.    Allergies  Shellfish allergy; Aspirin; and Other  Home Medications   Current Outpatient Rx  Name  Route  Sig  Dispense  Refill  . albuterol (PROVENTIL HFA;VENTOLIN HFA) 108 (90 BASE) MCG/ACT inhaler   Inhalation   Inhale 2 puffs into the lungs every 4 (four) hours as needed for wheezing or shortness of breath.   1 Inhaler   3   . albuterol (PROVENTIL) (5 MG/ML) 0.5% nebulizer solution   Nebulization   Take 0.5 mLs (2.5 mg total) by nebulization every 4 (four) hours as needed for wheezing or shortness of breath.   20 mL   0   . Astragalus Extract POWD   Oral   Take 1 capsule by mouth every morning.         . ferrous sulfate 325 (65 FE) MG tablet   Oral   Take 325 mg by mouth daily with breakfast.         . levofloxacin (LEVAQUIN) 750 MG tablet   Oral   Take 1  tablet (750 mg total) by mouth daily.   10 tablet   0   . oxyCODONE-acetaminophen (PERCOCET/ROXICET) 5-325 MG per tablet   Oral   Take 1-2 tablets by mouth every 4 (four) hours as needed for severe pain (moderate to severe pain (when tolerating fluids)).   30 tablet   0   . Phenylephrine-DM-GG-APAP (MUCINEX FAST-MAX COLD FLU PO)   Oral   Take 20 mLs by mouth 2 (two) times daily as needed (cold/cough).         . predniSONE (DELTASONE) 20 MG tablet   Oral   Take 2 tablets (40 mg total) by mouth daily.   10 tablet   0   . sulfamethoxazole-trimethoprim (BACTRIM DS) 800-160 MG per tablet   Oral   Take 1 tablet by mouth 2 (two) times daily.         . vitamin C (ASCORBIC ACID) 500 MG tablet   Oral   Take 1,000 mg by mouth every morning.         . vitamin E 400 UNIT capsule   Oral   Take  400 Units by mouth every morning.          BP 133/73  Pulse 66  Temp(Src) 98.4 F (36.9 C)  Resp 20  SpO2 99%  LMP 12/26/2012  Physical Exam  Nursing note and vitals reviewed. Constitutional: She is oriented to person, place, and time. She appears well-developed and well-nourished. No distress.  HENT:  Head: Normocephalic and atraumatic.  Mouth/Throat: Oropharynx is clear and moist.  Eyes: Conjunctivae and EOM are normal. Pupils are equal, round, and reactive to light.  Neck: Normal range of motion. Neck supple.  Cardiovascular: Normal rate, regular rhythm and normal heart sounds.   Pulmonary/Chest: Effort normal and breath sounds normal. No respiratory distress. She has no wheezes.  Musculoskeletal: Normal range of motion.  Neurological: She is alert and oriented to person, place, and time.  Skin: Skin is warm and dry. She is not diaphoretic.  Right axilla with small abscess; already open and actively draining purulent material; no surrounding cellulitic changes  Psychiatric: She has a normal mood and affect.    ED Course  Procedures (including critical care time) Labs Review Labs Reviewed - No data to display Imaging Review No results found.  EKG Interpretation   None       MDM   Final diagnoses:  Abscess   Right axillary abscess already open and draining purulent fluid.  No surrounding cellulitic changes, afebrile in the ED-- do not feel abx indicated at this time.  Encouraged to continue warm compresses at home.  rx tramadol.  Discussed plan with pt, she acknowledged understanding and agreed with plan of care.  Larene Pickett, PA-C 05/03/13 1338

## 2013-05-03 NOTE — ED Notes (Signed)
Pt c/o possible hair bump under right arm x 1 wk; yesterday developed head under arm; hard knot noted

## 2013-05-03 NOTE — ED Provider Notes (Signed)
Medical screening examination/treatment/procedure(s) were performed by non-physician practitioner and as supervising physician I was immediately available for consultation/collaboration.  EKG Interpretation   None         Hoy Morn, MD 05/03/13 1556

## 2013-10-27 ENCOUNTER — Emergency Department (HOSPITAL_COMMUNITY)
Admission: EM | Admit: 2013-10-27 | Discharge: 2013-10-27 | Disposition: A | Discharge status: Home / Self Care | Payer: Managed Care, Other (non HMO) | Attending: Emergency Medicine | Admitting: Emergency Medicine

## 2013-10-27 DIAGNOSIS — H53149 Visual discomfort, unspecified: Secondary | ICD-10-CM | POA: Diagnosis not present

## 2013-10-27 DIAGNOSIS — J45909 Unspecified asthma, uncomplicated: Secondary | ICD-10-CM | POA: Insufficient documentation

## 2013-10-27 DIAGNOSIS — H5710 Ocular pain, unspecified eye: Secondary | ICD-10-CM | POA: Insufficient documentation

## 2013-10-27 DIAGNOSIS — Z87891 Personal history of nicotine dependence: Secondary | ICD-10-CM | POA: Diagnosis not present

## 2013-10-27 DIAGNOSIS — Z792 Long term (current) use of antibiotics: Secondary | ICD-10-CM | POA: Diagnosis not present

## 2013-10-27 DIAGNOSIS — H5711 Ocular pain, right eye: Secondary | ICD-10-CM

## 2013-10-27 DIAGNOSIS — H539 Unspecified visual disturbance: Secondary | ICD-10-CM | POA: Diagnosis not present

## 2013-10-27 DIAGNOSIS — IMO0002 Reserved for concepts with insufficient information to code with codable children: Secondary | ICD-10-CM | POA: Diagnosis not present

## 2013-10-27 DIAGNOSIS — H579 Unspecified disorder of eye and adnexa: Secondary | ICD-10-CM | POA: Insufficient documentation

## 2013-10-27 MED ORDER — TETRACAINE HCL 0.5 % OP SOLN
2.0000 [drp] | Freq: Once | OPHTHALMIC | Status: AC
  Administered 2013-10-27: 2 [drp] via OPHTHALMIC
  Filled 2013-10-27: qty 2

## 2013-10-27 MED ORDER — FLUORESCEIN SODIUM 1 MG OP STRP: 1.0000 | ORAL_STRIP | Freq: Once | OPHTHALMIC | Status: DC

## 2013-10-27 MED ORDER — FLUORESCEIN SODIUM 1 MG OP STRP
1.0000 | ORAL_STRIP | Freq: Once | OPHTHALMIC | Status: AC
  Administered 2013-10-27: 1 via OPHTHALMIC

## 2013-10-27 MED ORDER — HYDROMORPHONE HCL PF 1 MG/ML IJ SOLN
0.5000 mg | Freq: Once | INTRAMUSCULAR | Status: AC
  Administered 2013-10-27: 0.5 mg via INTRAMUSCULAR
  Filled 2013-10-27: qty 1

## 2013-10-27 NOTE — Discharge Instructions (Signed)
Follow up with Dr. Lucita Ferrara now at [address] [place] [address].   Dr. Baron Hamper phone number is [phone].  Text Dr. Lucita Ferrara once you get there.

## 2013-10-27 NOTE — ED Notes (Signed)
Pt has had 2 corneal transplants.  Rt eye pain.  States she still has stitches in her eye from her transplant in 2009.  States her doctor is in [place] and did not want to drive all the way out there.  Wants her stitches removed.

## 2013-10-27 NOTE — ED Provider Notes (Signed)
CSN: [id number]     Arrival date & time 10/27/13  1143 History   First MD Initiated Contact with Patient 10/27/13 1227     Chief Complaint  Patient presents with  . Eye Problem     (Consider location/radiation/quality/duration/timing/severity/associated sxs/prior Treatment) Patient is a 38 y.o. female presenting with eye problem. The history is provided by the patient.  Eye Problem Location:  R eye Quality:  Burning Severity:  Mild Onset quality:  Gradual Duration:  1 day Timing:  Constant Progression:  Unchanged Chronicity:  New Context comment:  Spontaneously Relieved by:  Nothing Worsened by:  Nothing tried Ineffective treatments:  None tried Associated symptoms: photophobia   Associated symptoms: no headaches, no nausea and no vomiting     Past Medical History  Diagnosis Date  . Asthma     no inhaler -no problem as adult  . Environmental allergies    Past Surgical History  Procedure Laterality Date  . Cesarean section    . Corneal transplant      surgeries x 3, bilateral left x 2, right x 1  . Cholecystectomy    . Laparoscopy N/A 08/08/2012    Procedure: LAPAROSCOPY OPERATIVE;  Surgeon: Cheri Fowler, MD;  Location: [facility];  Service: Gynecology;  Laterality: N/A;  . Laparoscopic assisted vaginal hysterectomy N/A 01/18/2013    Procedure: LAPAROSCOPIC ASSISTED VAGINAL HYSTERECTOMY;  Surgeon: Cheri Fowler, MD;  Location: [facility];  Service: Gynecology;  Laterality: N/A;  . Bilateral salpingectomy Bilateral 01/18/2013    Procedure: BILATERAL SALPINGECTOMY;  Surgeon: Cheri Fowler, MD;  Location: [facility];  Service: Gynecology;  Laterality: Bilateral;  . Abdominal hysterectomy     Family History  Problem Relation Age of Onset  . Asthma Mother    History  Substance Use Topics  . Smoking status: Former Smoker -- 0.50 packs/day for 10 years    Types: Cigarettes    Quit date: 12/03/2012  . Smokeless tobacco: Never Used     Comment: quit smoking in approx. Sept 2014   . Alcohol Use: Yes     Comment: socially   OB History   Grav Para Term Preterm Abortions TAB SAB Ect Mult Living   1 1        1      Review of Systems  Constitutional: Negative for fever and fatigue.  HENT: Negative for congestion and drooling.   Eyes: Positive for photophobia and visual disturbance (mild blurry vision in right eye but does not think this is a new finding). Negative for pain.  Respiratory: Negative for cough and shortness of breath.   Cardiovascular: Negative for chest pain.  Gastrointestinal: Negative for nausea, vomiting, abdominal pain and diarrhea.  Genitourinary: Negative for dysuria and hematuria.  Musculoskeletal: Negative for back pain, gait problem and neck pain.  Skin: Negative for color change.  Neurological: Negative for dizziness and headaches.  Hematological: Negative for adenopathy.  Psychiatric/Behavioral: Negative for behavioral problems.  All other systems reviewed and are negative.     Allergies  Shellfish allergy; Aspirin; and Other  Home Medications   Prior to Admission medications   Medication Sig Start Date End Date Taking? Authorizing Provider  ofloxacin (OCUFLOX) 0.3 % ophthalmic solution Place 1 drop into the right eye 4 (four) times daily.   Yes Historical Provider, MD  prednisoLONE acetate (PRED FORTE) 1 % ophthalmic suspension Place 1 drop into both eyes daily.   Yes Historical Provider, MD  traMADol (ULTRAM) 50 MG tablet Take 50 mg by mouth every 6 (six) hours  as needed for moderate pain.   Yes Historical Provider, MD   BP 125/85  Pulse 69  Temp(Src) 99 F (37.2 C) (Oral)  Resp 18  SpO2 100%  LMP 12/26/2012 Physical Exam  Nursing note and vitals reviewed. Constitutional: She is oriented to person, place, and time. She appears well-developed and well-nourished.  HENT:  Head: Normocephalic and atraumatic.  Mouth/Throat: Oropharynx is clear and moist. No oropharyngeal exudate.  Normal appearing conjunctiva bilaterally. No  conjunctival injection noted. No discharge noted from the medial canthus of the eye. Extraocular movements intact.  Eyes: Conjunctivae and EOM are normal. Pupils are equal, round, and reactive to light.  Neck: Normal range of motion. Neck supple.  Cardiovascular: Normal rate, regular rhythm, normal heart sounds and intact distal pulses.  Exam reveals no gallop and no friction rub.   No murmur heard. Pulmonary/Chest: Effort normal and breath sounds normal. No respiratory distress. She has no wheezes.  Abdominal: Soft. Bowel sounds are normal. There is no tenderness. There is no rebound and no guarding.  Musculoskeletal: Normal range of motion. She exhibits no edema and no tenderness.  Neurological: She is alert and oriented to person, place, and time.  Skin: Skin is warm and dry.  Psychiatric: She has a normal mood and affect. Her behavior is normal.    ED Course  Procedures (including critical care time) Labs Review Labs Reviewed - No data to display  Imaging Review No results found.   EKG Interpretation None      MDM   Final diagnoses:  Acute right eye pain    12:40 PM 38 y.o. female with a history of bilateral corneal transplants who presents with right eye pain which began yesterday. The patient states that initially she thought she had a foreign body in her eye. She continued to rub her right eye throughout the day and night. She now notes photosensitivity and a burning sensation in the right eye. She is afebrile and vital signs are unremarkable here. Will check IOP, eval for abrasion, visual acuity.   IOP 17 in right eye, 14 in left eye. Visual acuity is 20/50 in right eye, 20/40 in left eye. No abrasions noted upon staining. Discussed case w/ optho resident at [facility] who recommended I call our local optho. Discussed w/ Dr. Lucita Ferrara who will eval the pt in his office now. Will d/c w/ plan for pt to go to his office for eval.   7:33 AM:  I have discussed the  diagnosis/risks/treatment options with the patient and believe the pt to be eligible for discharge home to follow-up with Dr. Lucita Ferrara now. We also discussed returning to the ED immediately if new or worsening sx occur. We discussed the sx which are most concerning (e.g., worsening pain, dec vision) that necessitate immediate return. Medications administered to the patient during their visit and any new prescriptions provided to the patient are listed below.  Medications given during this visit Medications  tetracaine (PONTOCAINE) 0.5 % ophthalmic solution 2 drop (2 drops Right Eye Given 10/27/13 1245)  fluorescein ophthalmic strip 1 strip (1 strip Both Eyes Given 10/27/13 1245)  HYDROmorphone (DILAUDID) injection 0.5 mg (0.5 mg Intramuscular Given 10/27/13 1430)    Discharge Medication List as of 10/27/2013  2:39 PM         Pamella Pert, MD 10/28/13 [phone]

## 2013-11-16 ENCOUNTER — Emergency Department (HOSPITAL_COMMUNITY): Payer: Worker's Compensation

## 2013-11-16 ENCOUNTER — Encounter (HOSPITAL_COMMUNITY): Payer: Self-pay | Admitting: Emergency Medicine

## 2013-11-16 ENCOUNTER — Emergency Department (HOSPITAL_COMMUNITY)
Admission: EM | Admit: 2013-11-16 | Discharge: 2013-11-16 | Disposition: A | Discharge status: Home / Self Care | Payer: Worker's Compensation | Attending: Emergency Medicine | Admitting: Emergency Medicine

## 2013-11-16 DIAGNOSIS — Z87891 Personal history of nicotine dependence: Secondary | ICD-10-CM | POA: Insufficient documentation

## 2013-11-16 DIAGNOSIS — J45909 Unspecified asthma, uncomplicated: Secondary | ICD-10-CM | POA: Diagnosis not present

## 2013-11-16 DIAGNOSIS — Z792 Long term (current) use of antibiotics: Secondary | ICD-10-CM | POA: Insufficient documentation

## 2013-11-16 DIAGNOSIS — W010XXA Fall on same level from slipping, tripping and stumbling without subsequent striking against object, initial encounter: Secondary | ICD-10-CM | POA: Insufficient documentation

## 2013-11-16 DIAGNOSIS — Y9289 Other specified places as the place of occurrence of the external cause: Secondary | ICD-10-CM | POA: Insufficient documentation

## 2013-11-16 DIAGNOSIS — S99929A Unspecified injury of unspecified foot, initial encounter: Principal | ICD-10-CM

## 2013-11-16 DIAGNOSIS — IMO0002 Reserved for concepts with insufficient information to code with codable children: Secondary | ICD-10-CM | POA: Insufficient documentation

## 2013-11-16 DIAGNOSIS — S99919A Unspecified injury of unspecified ankle, initial encounter: Principal | ICD-10-CM

## 2013-11-16 DIAGNOSIS — X500XXA Overexertion from strenuous movement or load, initial encounter: Secondary | ICD-10-CM | POA: Diagnosis not present

## 2013-11-16 DIAGNOSIS — Y9389 Activity, other specified: Secondary | ICD-10-CM | POA: Insufficient documentation

## 2013-11-16 DIAGNOSIS — S8990XA Unspecified injury of unspecified lower leg, initial encounter: Secondary | ICD-10-CM | POA: Diagnosis present

## 2013-11-16 DIAGNOSIS — M25572 Pain in left ankle and joints of left foot: Secondary | ICD-10-CM

## 2013-11-16 MED ORDER — HYDROCODONE-ACETAMINOPHEN 5-325 MG PO TABS: 1.0000 | ORAL_TABLET | Freq: Four times a day (QID) | ORAL | Status: DC | PRN

## 2013-11-16 NOTE — ED Provider Notes (Signed)
Medical screening examination/treatment/procedure(s) were performed by non-physician practitioner and as supervising physician I was immediately available for consultation/collaboration.   EKG Interpretation None       Jasper Riling. Alvino Chapel, MD 11/16/13 2355

## 2013-11-16 NOTE — Discharge Instructions (Signed)

## 2013-11-16 NOTE — ED Notes (Signed)
Pt reports falling in the cafeteria here at [employer] on Wednesday. Pt states that when she fell that her left foot/ankle twisted. Pt states that she was seen at urgent care, however reports pain that has not been relieved with tramadol and ibuprofen. Pt reports decreased swelling. Pt is ambulatory with one crutch, in NAD, and vitals are WDL.

## 2013-11-16 NOTE — ED Provider Notes (Signed)
CSN: [id number]     Arrival date & time 11/16/13  1730 History  This chart was scribed for non-physician practitioner working with Ovid Curd R. Alvino Chapel, MD, by Peyton Bottoms ED Scribe. This patient was seen in room WTR8/WTR8 and the patient's care was started at 6:24 PM   Chief Complaint  Patient presents with  . Foot Pain   The history is provided by the patient and the spouse. No language interpreter was used.   HPI Comments: Julie Munoz is a 38 y.o. female who presents to the Emergency Department with chief complaint of moderate right foot pain due to a fall that occurred 3 days ago while at work.  Patient states that she stepped on grease when she slipped and "rolled her ankle" during fall. She states that she heard a "slight pop". She states that she continued to work for another hour before she sat down when she noticed her left foot beginning to swell "really bad". She states that she went to urgent care on [facility], and the doctor there informed her that she "possibly might have a small break, but could not see it through xray". She was discharged with medications and was told to follow up with orthopaedics. Patient states that she did not hear back from orthopaedics to make an appointment, and decided to come into the ER. Patient states that she can walk with the boot she was given, but feels shooting pain up her leg if she tries to walk without it. Patient expressed desire to get an xray of her left ankle.  Past Medical History  Diagnosis Date  . Asthma     no inhaler -no problem as adult  . Environmental allergies    Past Surgical History  Procedure Laterality Date  . Cesarean section    . Corneal transplant      surgeries x 3, bilateral left x 2, right x 1  . Cholecystectomy    . Laparoscopy N/A 08/08/2012    Procedure: LAPAROSCOPY OPERATIVE;  Surgeon: Cheri Fowler, MD;  Location: [facility];  Service: Gynecology;  Laterality: N/A;  . Laparoscopic assisted vaginal  hysterectomy N/A 01/18/2013    Procedure: LAPAROSCOPIC ASSISTED VAGINAL HYSTERECTOMY;  Surgeon: Cheri Fowler, MD;  Location: [facility];  Service: Gynecology;  Laterality: N/A;  . Bilateral salpingectomy Bilateral 01/18/2013    Procedure: BILATERAL SALPINGECTOMY;  Surgeon: Cheri Fowler, MD;  Location: [facility];  Service: Gynecology;  Laterality: Bilateral;  . Abdominal hysterectomy     Family History  Problem Relation Age of Onset  . Asthma Mother    History  Substance Use Topics  . Smoking status: Former Smoker -- 0.50 packs/day for 10 years    Types: Cigarettes    Quit date: 12/03/2012  . Smokeless tobacco: Never Used     Comment: quit smoking in approx. Sept 2014  . Alcohol Use: Yes     Comment: socially   OB History   Grav Para Term Preterm Abortions TAB SAB Ect Mult Living   1 1        1      Review of Systems  Constitutional: Negative for fever and chills.  Respiratory: Negative for shortness of breath.   Cardiovascular: Negative for chest pain.  Gastrointestinal: Negative for nausea, vomiting, diarrhea and constipation.  Genitourinary: Negative for dysuria.  Musculoskeletal: Positive for arthralgias and joint swelling.       Foot pain  Skin: Negative for wound.   Allergies  Shellfish allergy; Aspirin; and Other  Home Medications  Prior to Admission medications   Medication Sig Start Date End Date Taking? Authorizing Provider  ofloxacin (OCUFLOX) 0.3 % ophthalmic solution Place 1 drop into the right eye 4 (four) times daily.    Historical Provider, MD  prednisoLONE acetate (PRED FORTE) 1 % ophthalmic suspension Place 1 drop into both eyes daily.    Historical Provider, MD  traMADol (ULTRAM) 50 MG tablet Take 50 mg by mouth every 6 (six) hours as needed for moderate pain.    Historical Provider, MD   Triage Vitals: BP 136/56  Pulse 67  Temp(Src) 98.4 F (36.9 C) (Oral)  Resp 18  SpO2 99%  LMP 12/26/2012  Physical Exam  Nursing note and vitals  reviewed. Constitutional: She is oriented to person, place, and time. She appears well-developed and well-nourished. No distress.  HENT:  Head: Normocephalic and atraumatic.  Eyes: Conjunctivae and EOM are normal.  Neck: Neck supple. No tracheal deviation present.  Cardiovascular: Normal rate, regular rhythm, normal heart sounds and intact distal pulses.   Intact distal pulses and brisk capillary refill.  Pulmonary/Chest: Effort normal and breath sounds normal. No respiratory distress.  Musculoskeletal: Normal range of motion. She exhibits tenderness.  Tenderness to palpation over the left ATFL, no bony abnormality or deformity, range of motion and strength limited due to pain.  Neurological: She is alert and oriented to person, place, and time.  Sensation in tact.  Skin: Skin is warm and dry.  Psychiatric: She has a normal mood and affect. Her behavior is normal.    ED Course  Procedures (including critical care time)  DIAGNOSTIC STUDIES: Oxygen Saturation is 99% on RA, normal by my interpretation.    COORDINATION OF CARE: 6:32 PM- Discussed plans to order diagnostic imaging of left ankle. Pt advised of plan for treatment and pt agrees.  Labs Review Labs Reviewed - No data to display  Imaging Review No results found.   EKG Interpretation None      MDM   Final diagnoses:  Ankle pain, left    Patient with right ankle sprain. Plain films are negative. Discharge to home with orthopedic followup.  Has a boot and crutches.  Orthopedic follow-up.  I personally performed the services described in this documentation, which was scribed in my presence. The recorded information has been reviewed and is accurate.     Montine Circle, PA-C 11/16/13 2012

## 2014-01-07 ENCOUNTER — Encounter (HOSPITAL_COMMUNITY): Payer: Self-pay | Admitting: Emergency Medicine

## 2014-01-20 ENCOUNTER — Emergency Department (HOSPITAL_COMMUNITY)
Admission: EM | Admit: 2014-01-20 | Discharge: 2014-01-20 | Disposition: A | Discharge status: Home / Self Care | Payer: Managed Care, Other (non HMO) | Attending: Emergency Medicine | Admitting: Emergency Medicine

## 2014-01-20 ENCOUNTER — Encounter (HOSPITAL_COMMUNITY): Payer: Self-pay

## 2014-01-20 DIAGNOSIS — Z7952 Long term (current) use of systemic steroids: Secondary | ICD-10-CM | POA: Insufficient documentation

## 2014-01-20 DIAGNOSIS — Z87891 Personal history of nicotine dependence: Secondary | ICD-10-CM | POA: Insufficient documentation

## 2014-01-20 DIAGNOSIS — J45909 Unspecified asthma, uncomplicated: Secondary | ICD-10-CM | POA: Insufficient documentation

## 2014-01-20 DIAGNOSIS — T7840XA Allergy, unspecified, initial encounter: Secondary | ICD-10-CM

## 2014-01-20 DIAGNOSIS — L236 Allergic contact dermatitis due to food in contact with the skin: Secondary | ICD-10-CM | POA: Insufficient documentation

## 2014-01-20 DIAGNOSIS — R21 Rash and other nonspecific skin eruption: Secondary | ICD-10-CM | POA: Diagnosis present

## 2014-01-20 MED ORDER — METHYLPREDNISOLONE SODIUM SUCC 125 MG IJ SOLR
125.0000 mg | Freq: Once | INTRAMUSCULAR | Status: AC
  Administered 2014-01-20: 125 mg via INTRAVENOUS
  Filled 2014-01-20: qty 2

## 2014-01-20 MED ORDER — FAMOTIDINE IN NACL 20-0.9 MG/50ML-% IV SOLN
20.0000 mg | Freq: Once | INTRAVENOUS | Status: AC
  Administered 2014-01-20: 20 mg via INTRAVENOUS
  Filled 2014-01-20: qty 50

## 2014-01-20 MED ORDER — ACETAMINOPHEN 500 MG PO TABS
1000.0000 mg | ORAL_TABLET | Freq: Once | ORAL | Status: AC
  Administered 2014-01-20: 1000 mg via ORAL
  Filled 2014-01-20: qty 2

## 2014-01-20 MED ORDER — DIPHENHYDRAMINE HCL 50 MG/ML IJ SOLN
50.0000 mg | Freq: Once | INTRAMUSCULAR | Status: AC
  Administered 2014-01-20: 50 mg via INTRAVENOUS
  Filled 2014-01-20: qty 1

## 2014-01-20 MED ORDER — EPINEPHRINE 0.3 MG/0.3ML IJ SOAJ: 0.3000 mg | Freq: Once | INTRAMUSCULAR | Status: DC

## 2014-01-20 MED ORDER — PREDNISONE 20 MG PO TABS: 60.0000 mg | ORAL_TABLET | Freq: Every day | ORAL | Status: DC

## 2014-01-20 MED ORDER — DIPHENHYDRAMINE HCL 50 MG/ML IJ SOLN
25.0000 mg | Freq: Once | INTRAMUSCULAR | Status: DC
Start: 2014-01-20 — End: 2014-01-20

## 2014-01-20 NOTE — ED Notes (Signed)
Bed: WA04 Expected date:  Expected time:  Means of arrival:  Comments: Per patty

## 2014-01-20 NOTE — ED Provider Notes (Signed)
CSN: [id number]     Arrival date & time 01/20/14  1409 History   First MD Initiated Contact with Patient 01/20/14 1410     Chief Complaint  Patient presents with  . Allergic Reaction     (Consider location/radiation/quality/duration/timing/severity/associated sxs/prior Treatment) HPI Ms. Sottile is a 38 year old female with past medical history of asthma and environmental allergies, allergy to shellfish who presents the ER with complaint of an allergic reaction. Patient states she works in [employer], and reports that she was handling a bag of shrimp, and noticed several minutes afterwards that she began to break out in hives on her arms, chest, back, legs. Patient states that she also began to notice a sensation that her throat was closing. Patient denies having shortness of breath, nausea, vomiting, chest pain, dizziness, syncope. Patient states she's had an allergic reaction to shellfish before which was severe in nature.    Past Medical History  Diagnosis Date  . Asthma     no inhaler -no problem as adult  . Environmental allergies    Past Surgical History  Procedure Laterality Date  . Cesarean section    . Corneal transplant      surgeries x 3, bilateral left x 2, right x 1  . Cholecystectomy    . Laparoscopy N/A 08/08/2012    Procedure: LAPAROSCOPY OPERATIVE;  Surgeon: Cheri Fowler, MD;  Location: [facility];  Service: Gynecology;  Laterality: N/A;  . Laparoscopic assisted vaginal hysterectomy N/A 01/18/2013    Procedure: LAPAROSCOPIC ASSISTED VAGINAL HYSTERECTOMY;  Surgeon: Cheri Fowler, MD;  Location: [facility];  Service: Gynecology;  Laterality: N/A;  . Bilateral salpingectomy Bilateral 01/18/2013    Procedure: BILATERAL SALPINGECTOMY;  Surgeon: Cheri Fowler, MD;  Location: [facility];  Service: Gynecology;  Laterality: Bilateral;  . Abdominal hysterectomy     Family History  Problem Relation Age of Onset  . Asthma Mother    History  Substance Use Topics  . Smoking  status: Former Smoker -- 0.50 packs/day for 10 years    Types: Cigarettes    Quit date: 12/03/2012  . Smokeless tobacco: Never Used     Comment: quit smoking in approx. Sept 2014  . Alcohol Use: Yes     Comment: socially   OB History    Gravida Para Term Preterm AB TAB SAB Ectopic Multiple Living   1 1        1      Review of Systems  Constitutional: Negative for fever.  HENT: Positive for trouble swallowing and voice change.   Eyes: Negative for visual disturbance.  Respiratory: Negative for shortness of breath.   Cardiovascular: Negative for chest pain.  Gastrointestinal: Negative for nausea, vomiting and abdominal pain.  Genitourinary: Negative for dysuria.  Musculoskeletal: Negative for neck pain.  Skin: Positive for rash.  Neurological: Negative for dizziness, weakness and numbness.  Psychiatric/Behavioral: Negative.       Allergies  Shellfish allergy; Aspirin; and Other  Home Medications   Prior to Admission medications   Medication Sig Start Date End Date Taking? Authorizing Provider  HYDROcodone-acetaminophen (NORCO/VICODIN) 5-325 MG per tablet Take 1-2 tablets by mouth every 6 (six) hours as needed for moderate pain or severe pain. 11/16/13  Yes Montine Circle, PA-C  prednisoLONE acetate (PRED FORTE) 1 % ophthalmic suspension Place 1 drop into both eyes daily.   Yes Historical Provider, MD  traMADol (ULTRAM) 50 MG tablet Take 50 mg by mouth every 6 (six) hours as needed for moderate pain.   Yes Historical Provider,  MD  EPINEPHrine (EPIPEN 2-PAK) 0.3 mg/0.3 mL IJ SOAJ injection Inject 0.3 mLs (0.3 mg total) into the muscle once. 01/20/14   Carrie Mew, PA-C  predniSONE (DELTASONE) 20 MG tablet Take 3 tablets (60 mg total) by mouth daily with breakfast. For 5 days. 01/20/14   Carrie Mew, PA-C   BP 125/76 mmHg  Pulse 61  Temp(Src) 97.7 F (36.5 C) (Oral)  Resp 14  SpO2 98%  LMP 12/26/2012 Physical Exam  Constitutional: She appears well-developed and  well-nourished. No distress.  HENT:  Head: Normocephalic and atraumatic.  Mouth/Throat: Uvula is midline, oropharynx is clear and moist and mucous membranes are normal. No trismus in the jaw. No uvula swelling. No oropharyngeal exudate, posterior oropharyngeal edema, posterior oropharyngeal erythema or tonsillar abscesses.  Eyes: Right eye exhibits no discharge. Left eye exhibits no discharge. No scleral icterus.  Neck: Normal range of motion.  Cardiovascular: Normal rate, regular rhythm and normal heart sounds.   No murmur heard. Pulmonary/Chest: Effort normal and breath sounds normal. No accessory muscle usage. No tachypnea. No respiratory distress.  Abdominal: Soft. Normal appearance and bowel sounds are normal. There is no tenderness.  Musculoskeletal: Normal range of motion. She exhibits no edema or tenderness.  Neurological: She is alert. She has normal strength. No cranial nerve deficit. Coordination normal. GCS eye subscore is 4. GCS verbal subscore is 5. GCS motor subscore is 6.  Patient fully alert answering questions appropriately in full, clear sentences.  Skin: Skin is warm and dry. Rash noted. Rash is urticarial. She is not diaphoretic. No pallor.  Diffuse maculopapular, erythematous rash noted to patient's forearms, upper arms, chest, diffuse across her back, and upper thighs.  Psychiatric: She has a normal mood and affect.  Nursing note and vitals reviewed.   ED Course  Procedures (including critical care time) Labs Review Labs Reviewed - No data to display  Imaging Review No results found.   EKG Interpretation None      MDM   Final diagnoses:  Allergic reaction, initial encounter   Patient presenting with allergic reaction to shellfish after handling a bag of shrimp in the cafeteria. Patient states she has a severe allergy to shellfish. Patient reporting a closing sensation throat, however no respiratory distress is noted on exam. Patient's oropharynx is clear,  nonedematous, no angioedema,no airway compromise noted. Patient has diffuse hives over her body, however vital signs are stable, no signs of anaphylaxis at this time. We'll treat with antihistamines and Solu-Medrol.  After medications patient stating that her symptoms are subsiding, discomfort in her throat is also subsiding. Patient well-appearing, sitting upright on the stretcher in no acute distress. Patient nontachypneic, non-hypoxic, not tachycardic, speaking in full, clear sentences.  4:30 PM: Patient stating she is a symptomatically this time. Rash has subsided, patient in no respiratory distress or any acute distress. No angioedema, patient stating her throat swelling has completely subsided. Patient stating she is ready to leave. We will provide patient with EpiPen, have her use Benadryl for the first 24 hours every 8 hours, and then when necessary. Also give patient 5 day course of prednisone. Offered patient to our primary care and have her follow-up with them. I discussed return precautions with patient and she was agreeable to this plan. I encouraged patient to call or return to the ER should her symptoms change, worsen, persist or should she have any questions or concerns.  BP 125/76 mmHg  Pulse 61  Temp(Src) 97.7 F (36.5 C) (Oral)  Resp 14  SpO2 98%  LMP 12/26/2012  Signed,  Dahlia Bailiff, PA-C 5:05 PM  Pt seen and discussed with Dr. Pryor Curia, MD.   Carrie Mew, PA-C 01/20/14 1705

## 2014-01-20 NOTE — Discharge Instructions (Signed)
Use prednisone 60 mg daily for the next 5 days. Take Benadryl 50 mg I'm mouth every 8 hours for the first 24 hours. Then use Benadryl 50 mg as needed for itching or hives. Use EpiPen if he develops severe shortness of breath, severe nausea, vomiting, dizziness, facial or mouth swelling.  Anaphylactic Reaction An anaphylactic reaction is a sudden, severe allergic reaction that involves the whole body. It can be life threatening. A hospital stay is often required. People with asthma, eczema, or hay fever are slightly more likely to have an anaphylactic reaction. CAUSES  An anaphylactic reaction may be caused by anything to which you are allergic. After being exposed to the allergic substance, your immune system becomes sensitized to it. When you are exposed to that allergic substance again, an allergic reaction can occur. Common causes of an anaphylactic reaction include:  Medicines.  Foods, especially peanuts, wheat, shellfish, milk, and eggs.  Insect bites or stings.  Blood products.  Chemicals, such as dyes, latex, and contrast material used for imaging tests. SYMPTOMS  When an allergic reaction occurs, the body releases histamine and other substances. These substances cause symptoms such as tightening of the airway. Symptoms often develop within seconds or minutes of exposure. Symptoms may include:  Skin rash or hives.  Itching.  Chest tightness.  Swelling of the eyes, tongue, or lips.  Trouble breathing or swallowing.  Lightheadedness or fainting.  Anxiety or confusion.  Stomach pains, vomiting, or diarrhea.  Nasal congestion.  A fast or irregular heartbeat (palpitations). DIAGNOSIS  Diagnosis is based on your history of recent exposure to allergic substances, your symptoms, and a physical exam. Your caregiver may also perform blood or urine tests to confirm the diagnosis. TREATMENT  Epinephrine medicine is the main treatment for an anaphylactic reaction. Other medicines  that may be used for treatment include antihistamines, steroids, and albuterol. In severe cases, fluids and medicine to support blood pressure may be given through an intravenous line (IV). Even if you improve after treatment, you need to be observed to make sure your condition does not get worse. This may require a stay in the hospital. [place] a medical alert bracelet or necklace stating your allergy.  You and your family must learn how to use an anaphylaxis kit or give an epinephrine injection to temporarily treat an emergency allergic reaction. Always carry your epinephrine injection or anaphylaxis kit with you. This can be lifesaving if you have a severe reaction.  Do not drive or perform tasks after treatment until the medicines used to treat your reaction have worn off, or until your caregiver says it is okay.  If you have hives or a rash:  Take medicines as directed by your caregiver.  You may use an over-the-counter antihistamine (diphenhydramine) as needed.  Apply cold compresses to the skin or take baths in cool water. Avoid hot baths or showers. SEEK MEDICAL CARE IF:   You develop symptoms of an allergic reaction to a new substance. Symptoms may start right away or minutes later.  You develop a rash, hives, or itching.  You develop new symptoms. SEEK IMMEDIATE MEDICAL CARE IF:   You have swelling of the mouth, difficulty breathing, or wheezing.  You have a tight feeling in your chest or throat.  You develop hives, swelling, or itching all over your body.  You develop severe vomiting or diarrhea.  You feel faint or pass out. This is an emergency. Use your epinephrine injection or anaphylaxis kit  as you have been instructed. Call your local emergency services (911 in [place] Even if you improve after the injection, you need to be examined at a hospital emergency department. MAKE SURE YOU:   Understand these instructions.  Will watch your  condition.  Will get help right away if you are not doing well or get worse. Document Released: 02/22/2005 Document Revised: 02/27/2013 Document Reviewed: 05/26/2011 [facility] Patient Information 2015 [place], [place]. This information is not intended to replace advice given to you by your health care provider. Make sure you discuss any questions you have with your health care provider.   Allergies Allergies may happen from anything your body is sensitive to. This may be food, medicines, pollens, chemicals, and nearly anything around you in everyday life that produces allergens. An allergen is anything that causes an allergy producing substance. Heredity is often a factor in causing these problems. This means you may have some of the same allergies as your parents. Food allergies happen in all age groups. Food allergies are some of the most severe and life threatening. Some common food allergies are cow's milk, seafood, eggs, nuts, wheat, and soybeans. SYMPTOMS   Swelling around the mouth.  An itchy red rash or hives.  Vomiting or diarrhea.  Difficulty breathing. SEVERE ALLERGIC REACTIONS ARE LIFE-THREATENING. This reaction is called anaphylaxis. It can cause the mouth and throat to swell and cause difficulty with breathing and swallowing. In severe reactions only a trace amount of food (for example, peanut oil in a salad) may cause death within seconds. Seasonal allergies occur in all age groups. These are seasonal because they usually occur during the same season every year. They may be a reaction to molds, grass pollens, or tree pollens. Other causes of problems are house dust mite allergens, pet dander, and mold spores. The symptoms often consist of nasal congestion, a runny itchy nose associated with sneezing, and tearing itchy eyes. There is often an associated itching of the mouth and ears. The problems happen when you come in contact with pollens and other allergens. Allergens are the  particles in the air that the body reacts to with an allergic reaction. This causes you to release allergic antibodies. Through a chain of events, these eventually cause you to release histamine into the blood stream. Although it is meant to be protective to the body, it is this release that causes your discomfort. This is why you were given anti-histamines to feel better. If you are unable to pinpoint the offending allergen, it may be determined by skin or blood testing. Allergies cannot be cured but can be controlled with medicine. Hay fever is a collection of all or some of the seasonal allergy problems. It may often be treated with simple over-the-counter medicine such as diphenhydramine. Take medicine as directed. Do not drink alcohol or drive while taking this medicine. Check with your caregiver or package insert for child dosages. If these medicines are not effective, there are many new medicines your caregiver can prescribe. Stronger medicine such as nasal spray, eye drops, and corticosteroids may be used if the first things you try do not work well. Other treatments such as immunotherapy or desensitizing injections can be used if all else fails. Follow up with your caregiver if problems continue. These seasonal allergies are usually not life threatening. They are generally more of a nuisance that can often be handled using medicine. HOME CARE INSTRUCTIONS   If unsure what causes a reaction, keep a diary of foods eaten and  symptoms that follow. Avoid foods that cause reactions.  If hives or rash are present:  Take medicine as directed.  You may use an over-the-counter antihistamine (diphenhydramine) for hives and itching as needed.  Apply cold compresses (cloths) to the skin or take baths in cool water. Avoid hot baths or showers. Heat will make a rash and itching worse.  If you are severely allergic:  Following a treatment for a severe reaction, hospitalization is often required for closer  follow-up.  Wear a medic-alert bracelet or necklace stating the allergy.  You and your family must learn how to give adrenaline or use an anaphylaxis kit.  If you have had a severe reaction, always carry your anaphylaxis kit or EpiPen with you. Use this medicine as directed by your caregiver if a severe reaction is occurring. Failure to do so could have a fatal outcome. SEEK MEDICAL CARE IF:  You suspect a food allergy. Symptoms generally happen within 30 minutes of eating a food.  Your symptoms have not gone away within 2 days or are getting worse.  You develop new symptoms.  You want to retest yourself or your child with a food or drink you think causes an allergic reaction. Never do this if an anaphylactic reaction to that food or drink has happened before. Only do this under the care of a caregiver. SEEK IMMEDIATE MEDICAL CARE IF:   You have difficulty breathing, are wheezing, or have a tight feeling in your chest or throat.  You have a swollen mouth, or you have hives, swelling, or itching all over your body.  You have had a severe reaction that has responded to your anaphylaxis kit or an EpiPen. These reactions may return when the medicine has worn off. These reactions should be considered life threatening. MAKE SURE YOU:   Understand these instructions.  Will watch your condition.  Will get help right away if you are not doing well or get worse. Document Released: 05/18/2002 Document Revised: 06/19/2012 Document Reviewed: 10/23/2007 [facility] Patient Information 2015 [place], [place]. This information is not intended to replace advice given to you by your health care provider. Make sure you discuss any questions you have with your health care provider.

## 2014-01-20 NOTE — ED Provider Notes (Signed)
Medical screening examination/treatment/procedure(s) were conducted as a shared visit with non-physician practitioner(s) and myself.  I personally evaluated the patient during the encounter.   EKG Interpretation None      Pt is a 38 y.o. F with history of shellfish allergy who presents the emergency department with urticaria and feeling like her throat is swelling after handling a box of shrimp earlier today. No other new exposures. She is ready feeling better after IV Solu-Medrol, Pepcid and Benadryl. No respiratory distress. Oropharynx is clear, no angioedema. Normal voice, no drooling, no stridor, no wheezing. Lungs clear to auscultation with good aeration. No hypoxia.  Urticaria are improving. No hypotension. We'll discharge with EpiPen, steroids.  [place], DO 01/20/14 [phone]

## 2014-01-20 NOTE — ED Notes (Signed)
Patient presents today from cafeteria. She was working and handled a box of fish which resulted in an urticarial reaction with hives present to anterior and posterior chest and bilateral upper extremities.

## 2014-04-18 ENCOUNTER — Emergency Department (HOSPITAL_COMMUNITY)
Admission: EM | Admit: 2014-04-18 | Discharge: 2014-04-18 | Disposition: A | Discharge status: Home / Self Care | Payer: Managed Care, Other (non HMO) | Attending: Emergency Medicine | Admitting: Emergency Medicine

## 2014-04-18 ENCOUNTER — Encounter (HOSPITAL_COMMUNITY): Payer: Self-pay | Admitting: Emergency Medicine

## 2014-04-18 ENCOUNTER — Emergency Department (HOSPITAL_COMMUNITY): Payer: Managed Care, Other (non HMO)

## 2014-04-18 DIAGNOSIS — E669 Obesity, unspecified: Secondary | ICD-10-CM | POA: Diagnosis not present

## 2014-04-18 DIAGNOSIS — J45909 Unspecified asthma, uncomplicated: Secondary | ICD-10-CM | POA: Diagnosis not present

## 2014-04-18 DIAGNOSIS — Z7952 Long term (current) use of systemic steroids: Secondary | ICD-10-CM | POA: Insufficient documentation

## 2014-04-18 DIAGNOSIS — R079 Chest pain, unspecified: Secondary | ICD-10-CM | POA: Insufficient documentation

## 2014-04-18 DIAGNOSIS — R05 Cough: Secondary | ICD-10-CM

## 2014-04-18 DIAGNOSIS — Z79899 Other long term (current) drug therapy: Secondary | ICD-10-CM | POA: Diagnosis not present

## 2014-04-18 DIAGNOSIS — R51 Headache: Secondary | ICD-10-CM | POA: Diagnosis not present

## 2014-04-18 DIAGNOSIS — Z87891 Personal history of nicotine dependence: Secondary | ICD-10-CM | POA: Insufficient documentation

## 2014-04-18 DIAGNOSIS — J209 Acute bronchitis, unspecified: Secondary | ICD-10-CM

## 2014-04-18 DIAGNOSIS — R059 Cough, unspecified: Secondary | ICD-10-CM

## 2014-04-18 LAB — CBC WITH DIFFERENTIAL/PLATELET
BASOS PCT: 0 % (ref 0–1)
Basophils Absolute: 0 10*3/uL (ref 0.0–0.1)
Eosinophils Absolute: 0.7 10*3/uL (ref 0.0–0.7)
Eosinophils Relative: 7 % — ABNORMAL HIGH (ref 0–5)
HCT: 40.5 % (ref 36.0–46.0)
HEMOGLOBIN: 13.7 g/dL (ref 12.0–15.0)
LYMPHS ABS: 1 10*3/uL (ref 0.7–4.0)
Lymphocytes Relative: 11 % — ABNORMAL LOW (ref 12–46)
MCH: 31.6 pg (ref 26.0–34.0)
MCHC: 33.8 g/dL (ref 30.0–36.0)
MCV: 93.5 fL (ref 78.0–100.0)
MONOS PCT: 6 % (ref 3–12)
Monocytes Absolute: 0.5 10*3/uL (ref 0.1–1.0)
NEUTROS ABS: 7.1 10*3/uL (ref 1.7–7.7)
Neutrophils Relative %: 76 % (ref 43–77)
Platelets: 251 10*3/uL (ref 150–400)
RBC: 4.33 MIL/uL (ref 3.87–5.11)
RDW: 14.1 % (ref 11.5–15.5)
WBC: 9.3 10*3/uL (ref 4.0–10.5)

## 2014-04-18 LAB — BASIC METABOLIC PANEL
ANION GAP: 8 (ref 5–15)
BUN: 10 mg/dL (ref 6–23)
CALCIUM: 9 mg/dL (ref 8.4–10.5)
CO2: 25 mmol/L (ref 19–32)
CREATININE: 0.81 mg/dL (ref 0.50–1.10)
Chloride: 105 mmol/L (ref 96–112)
Glucose, Bld: 124 mg/dL — ABNORMAL HIGH (ref 70–99)
Potassium: 4.4 mmol/L (ref 3.5–5.1)
Sodium: 138 mmol/L (ref 135–145)

## 2014-04-18 LAB — I-STAT BETA HCG BLOOD, ED (MC, WL, AP ONLY): HCG, QUANTITATIVE: 5.3 m[IU]/mL — AB (ref <5)

## 2014-04-18 LAB — I-STAT CG4 LACTIC ACID, ED: Lactic Acid, Venous: 1.86 mmol/L (ref 0.5–2.0)

## 2014-04-18 MED ORDER — ALBUTEROL SULFATE (2.5 MG/3ML) 0.083% IN NEBU
5.0000 mg | INHALATION_SOLUTION | Freq: Once | RESPIRATORY_TRACT | Status: AC
  Administered 2014-04-18: 5 mg via RESPIRATORY_TRACT
  Filled 2014-04-18: qty 6

## 2014-04-18 MED ORDER — PREDNISONE 50 MG PO TABS: ORAL_TABLET | ORAL | Status: DC

## 2014-04-18 MED ORDER — HYDROCODONE-ACETAMINOPHEN 5-325 MG PO TABS: ORAL_TABLET | ORAL | Status: DC

## 2014-04-18 MED ORDER — PREDNISONE 20 MG PO TABS
60.0000 mg | ORAL_TABLET | Freq: Once | ORAL | Status: AC
  Administered 2014-04-18: 60 mg via ORAL
  Filled 2014-04-18: qty 3

## 2014-04-18 MED ORDER — AZITHROMYCIN 250 MG PO TABS: ORAL_TABLET | ORAL | Status: DC

## 2014-04-18 MED ORDER — HYDROCODONE-ACETAMINOPHEN 5-325 MG PO TABS
1.0000 | ORAL_TABLET | Freq: Once | ORAL | Status: AC
  Administered 2014-04-18: 1 via ORAL
  Filled 2014-04-18: qty 1

## 2014-04-18 NOTE — ED Notes (Addendum)
Pt c/o productive cough with brown and fresh red blood x 2 weeks, originally attributed cough to regular cigarette use, yesterday pt began feeling body aches, chest pressure, head pressure, fever 102 degrees F. Pt c/o night sweats. Pt states she took Delsym cough and fever reducer today at 0700.

## 2014-04-18 NOTE — Discharge Instructions (Signed)
Return to the emergency room for any worsening or concerning symptoms including fast breathing, heart racing, confusion, vomiting.  Take your antibiotics as directed and to completion. You should never have any leftover antibiotics! Push fluids and stay well hydrated.   Any antibiotic use can reduce the efficacy of hormonal birth control. Please use back up method of contraception.   Rest, cover your mouth when you cough and wash your hands frequently.   Push fluids: water or Gatorade, do not drink any soda, juice or caffeinated beverages.  For fever and pain control you can take Motrin (ibuprofen) as follows: 400 mg (this is normally 2 over the counter pills) every 4 hours with food.  Do not return to work until a day after your fever breaks.   Take Vicodin for cough and pain control, do not drink alcohol, drive, care for children or do other critical tasks while taking Vicodin     Do not hesitate to return to the emergency room for any new, worsening or concerning symptoms.  Please obtain primary care using resource guide below. But the minute you were seen in the emergency room and that they will need to obtain records for further outpatient management.     Acute Bronchitis Bronchitis is inflammation of the airways that extend from the windpipe into the lungs (bronchi). The inflammation often causes mucus to develop. This leads to a cough, which is the most common symptom of bronchitis.  In acute bronchitis, the condition usually develops suddenly and goes away over time, usually in a couple weeks. Smoking, allergies, and asthma can make bronchitis worse. Repeated episodes of bronchitis may cause further lung problems.  CAUSES Acute bronchitis is most often caused by the same virus that causes a cold. The virus can spread from person to person (contagious) through coughing, sneezing, and touching contaminated objects. SIGNS AND SYMPTOMS   Cough.   Fever.   Coughing up mucus.    Body aches.   Chest congestion.   Chills.   Shortness of breath.   Sore throat.  DIAGNOSIS  Acute bronchitis is usually diagnosed through a physical exam. Your health care provider will also ask you questions about your medical history. Tests, such as chest X-rays, are sometimes done to rule out other conditions.  TREATMENT  Acute bronchitis usually goes away in a couple weeks. Oftentimes, no medical treatment is necessary. Medicines are sometimes given for relief of fever or cough. Antibiotic medicines are usually not needed but may be prescribed in certain situations. In some cases, an inhaler may be recommended to help reduce shortness of breath and control the cough. A cool mist vaporizer may also be used to help thin bronchial secretions and make it easier to clear the chest.  HOME CARE INSTRUCTIONS  Get plenty of rest.   Drink enough fluids to keep your urine clear or pale yellow (unless you have a medical condition that requires fluid restriction). Increasing fluids may help thin your respiratory secretions (sputum) and reduce chest congestion, and it will prevent dehydration.   Take medicines only as directed by your health care provider.  If you were prescribed an antibiotic medicine, finish it all even if you start to feel better.  Avoid smoking and secondhand smoke. Exposure to cigarette smoke or irritating chemicals will make bronchitis worse. If you are a smoker, consider using nicotine gum or skin patches to help control withdrawal symptoms. Quitting smoking will help your lungs heal faster.   Reduce the chances of another bout of  acute bronchitis by washing your hands frequently, avoiding people with cold symptoms, and trying not to touch your hands to your mouth, nose, or eyes.   Keep all follow-up visits as directed by your health care provider.  SEEK MEDICAL CARE IF: Your symptoms do not improve after 1 week of treatment.  SEEK IMMEDIATE MEDICAL CARE  IF:  You develop an increased fever or chills.   You have chest pain.   You have severe shortness of breath.  You have bloody sputum.   You develop dehydration.  You faint or repeatedly feel like you are going to pass out.  You develop repeated vomiting.  You develop a severe headache. MAKE SURE YOU:   Understand these instructions.  Will watch your condition.  Will get help right away if you are not doing well or get worse. Document Released: 04/01/2004 Document Revised: 07/09/2013 Document Reviewed: 08/15/2012 [facility] Patient Information 2015 [place], [place]. This information is not intended to replace advice given to you by your health care provider. Make sure you discuss any questions you have with your health care provider.    Emergency Department Resource Guide 1) Find a Doctor and Pay Out of Pocket Although you won't have to find out who is covered by your insurance plan, it is a good idea to ask around and get recommendations. You will then need to call the office and see if the doctor you have chosen will accept you as a new patient and what types of options they offer for patients who are self-pay. Some doctors offer discounts or will set up payment plans for their patients who do not have insurance, but you will need to ask so you aren't surprised when you get to your appointment.  2) Contact Your Local Health Department Not all health departments have doctors that can see patients for sick visits, but many do, so it is worth a call to see if yours does. If you don't know where your local health department is, you can check in your phone book. The CDC also has a tool to help you locate your state's health department, and many state websites also have listings of all of their local health departments.  3) Find a [facility] If your illness is not likely to be very severe or complicated, you may want to try a walk in clinic. These are popping up all over the  country in pharmacies, drugstores, and shopping centers. They're usually staffed by nurse practitioners or physician assistants that have been trained to treat common illnesses and complaints. They're usually fairly quick and inexpensive. However, if you have serious medical issues or chronic medical problems, these are probably not your best option.  No Primary Care Doctor: - Call Health Connect at  [phone] - they can help you locate a primary care doctor that  accepts your insurance, provides certain services, etc. - Physician Referral Service- [phone]  Chronic Pain Problems: Organization         Address  Phone   Notes  [facility]  [phone] Patients need to be referred by their primary care doctor.   Medication Assistance: Organization         Address  Phone   Notes  [facility] Medication [facility] [address] [place], [place] [address] [phone] --Must be a resident of [facility] -- Must have NO insurance coverage whatsoever (no Medicaid/ Medicare, etc.) -- The pt. MUST have a primary care doctor that  directs their care regularly and follows them in the community   MedAssist  [phone]   [employer]  [phone]    Agencies that provide inexpensive medical care: Organization         Address  Phone   Notes  [place]  [phone]   [facility]    [phone]   [facility] [place], [place] [address] [phone]   Liborio Negron Torres [address], [place] [phone]   Planned Parenthood    [phone]   [facility]    [phone]   [place] and [place] [address], [place] Phone:  [phone], Fax:  [phone] Hours of Operation:  9 am - 6 pm, M-F.  Also accepts Medicaid/Medicare and self-pay.  [facility] for [address], [address], [place] Phone: [phone], Fax: [phone]. Hours of Operation:  8:30 am - 5:30 pm, M-F.  Also accepts Medicaid and self-pay.  [facility] [place] [address] [place] Phone: [phone]   [place], [place], [place] [phone], Ext. [phone] Mondays & Thursdays: 7-9 AM.  First 15 patients are seen on a first come, first serve basis.    [place] Providers:  Organization         Address  Phone   Notes  [facility] [address], [place] [phone] Also accepts self-pay patients.  [facility] [address], [place]  [phone]   [place], [address], [place] [phone]   [facility] [address], [place] [phone]   Lucianne Lei [address], [place]   [phone] Only accepts [place] Access [place] patients after they have their name applied to their card.   Self-Pay (no insurance) in [facility]:  Organization         Address  Phone   Notes  Sickle Cell Patients, [facility] Internal Medicine [place] [phone]   [facility] [place] [phone]   [facility] [place]  [place], [place], [employer] [phone]   [facility]/Dr. Osei-Bonsu  [address], [place] or [address], [place] [phone] Phone number for both [place] and [place] locations is the same.  [facility] [address] [place] [phone]   [facility] [address], [place] or [address] [phone]   [facility] [address], [place] [phone], phone; [phone], fax Sees patients 1st and 3rd Saturday of every month.  Must  not qualify for public or private insurance (i.e. Medicaid, Medicare, [place] Health Choice, Veterans' Benefits)  Household income should be no more than 200% of the poverty level The clinic cannot treat you if you are pregnant or think you are pregnant  Sexually transmitted diseases are not treated at the clinic.    Dental Care: Organization         Address  Phone  Notes  [facility] [address] [place], [place] [phone] Accepts children up  to age 49 who are enrolled in Medicaid or [place] Health Choice; pregnant women with a Medicaid card; and children who have applied for Medicaid or [place] Health Choice, but were declined, whose parents can pay a reduced fee at time of service.  [facility]  [address], [place] [phone] Accepts children up to age 25 who are enrolled in [place] or [place]; pregnant women with a Medicaid card; and children who have applied for Medicaid or [place] Health Choice, but were declined, whose parents can pay a reduced fee at time of service.  [facility]  [place] [phone] Patients are seen by appointment only. Walk-ins are not accepted. [place] will see patients 64 years of age and older. Monday - Tuesday (8am-5pm) Most Wednesdays (8:30-5pm) $30 per visit, cash only  [facility] Adult Dental Access PROGRAM  [address], [facility] [phone] Patients are seen by appointment only. Walk-ins are not accepted. [place] will see patients 57 years of age and older. One Wednesday Evening (Monthly: Volunteer Based).  $30 per visit, cash only  [place]  [phone] for adults; Children under age 20, call Graduate Pediatric Dentistry at [phone]. Children aged 105-14, please call [phone] to request a pediatric application.  Dental services are  provided in all areas of dental care including fillings, crowns and bridges, complete and partial dentures, implants, gum treatment, root canals, and extractions. Preventive care is also provided. Treatment is provided to both adults and children. Patients are selected via a lottery and there is often a waiting list.   [facility] [address], [place]  [phone] [email or web address]   [facility] [address] [place], [place] [phone], Ext. [phone] Second and Fourth Thursday of each month, opens at 6:30 AM; Clinic ends at 9 AM.  Patients are seen on a first-come first-served basis, and a limited number are seen during each clinic.   [facility]  [address] Hillard Danker [place], [place] [phone]   Eligibility Requirements You must have lived in [place], [place], or [place] counties for at least the last three months.   You cannot be eligible for state or federal sponsored [employer], including [employer], [place], or [employer].   You generally cannot be eligible for healthcare insurance through your employer.    How to apply: Eligibility screenings are held every Tuesday and Wednesday afternoon from 1:00 pm until 4:00 pm. You do not need an appointment for the interview!  [facility] [address], [place], [place]   [place]  [place] Department  [place]  [phone]    Behavioral Health Resources in the Community: Intensive Outpatient Programs Organization         Address  Phone  Notes  Arnold Greenwood. [address], [place], [place] [phone]   [facility] Outpatient [address], [place], [place]   ADS: Alcohol & Drug Svcs [address], [place], [place]   [place] [address]  [place], [employer] or [phone]   Substance Abuse Resources Organization         Address  Phone  Notes  Alcohol and Drug Services  [phone]   [facility] Recovery  [facility]  [phone]   [facility] [place]   Chinita Pester  [phone]   Residential & Outpatient Substance Abuse Program  4027951571   Psychological Services Organization         Address  Phone  Notes  [facility] [place]  [place]  [phone]   [place] [address] [place] or [phone]    Mobile Crisis Teams Organization         Address  Phone  Notes  [facility], Mobile Crisis Care Unit  [phone]   [facility]  [address] [place], [place]   [place] Levels [address], [place] [phone]    Self-Help/Support Groups Organization         Address  Phone             Notes  [place]. of [place] - variety of support groups  [place] Call for more information  Narcotics Anonymous (NA), [facility] [address], [employer] [place]  2 meetings at this location   Special educational needs teacher         Address  Phone  Notes  [facility] Residential Treatment [place],    [place]  [phone]   [facility]  [address], [place] [id number], [place], [place]   [place], [place] [phone] Admissions: 8am-3pm M-F  Incentives Substance [place] [address],    [place], [place] [phone]   [facility] [address] [place], [place], [place]   [facility] [address]  [place], [place]   [facility]ight Programs - Intensive Outpatient [address] Kristeen [address], [place], [place]   [facility] ([place]  [place], [place] [phone] or  [phone]   [facility] Treatment Services (RTS) [address], [place], [place] Accepts Medicaid  [facility] [place] [address],  [place] 1-5858454224 Substance Abuse/Addiction Treatment   [facility] Organization         Address  Phone  Notes  [facility]  [phone]   Domenic Schwab, PhD [address] Arlis [place], [place]   [phone] or [phone]   [place] De[place]incy [place], [place] [phone]   [facility] [address] [place], [place] [phone] Insurance/Medicaid/sponsorship through [facility] and Families [address], [address] [place]                                    [place], [place] [phone] [place] [address], [place] [phone]    Dr. Adele Schilder  [phone]   [facility] Dept. 1) [address] [place] 2) [place] 3)  [place] [address], [place] [phone]  [phone]   [place] [phone] or [phone] (After Hours)

## 2014-04-18 NOTE — ED Provider Notes (Signed)
CSN: [id number]     Arrival date & time 04/18/14  1514 History  This chart was scribed for Monico Blitz, PA-C working with Orpah Greek, * by Mercy Moore, ED Scribe. This patient was seen in room WTR7/WTR7 and the patient's care was started at 4:03 PM.  CC: cough   The history is provided by the patient. No language interpreter was used.   HPI Comments: Julie Munoz is a 39 y.o. female who presents to the Emergency Department complaining of productive cough with blood tinged sputum, ongoing for two weeks. Patient reports presence of old brown and bright red blood when blowing her nose and when coughing. Patient reports post tussive emesis after violent/persistent coughing. Patient shares that she initially attributed her symptoms to her history of tobacco use. Patient shares history of asthma; she has an at home inhaler. Yesterday patient reports development of fever measured at 102F, myalgias, head pressure, and chest pressure. Patient reports treatment with Delsym cough and OTC medications. Patient reports receiving seasonal flu shot; she is [employer].   Past Medical History  Diagnosis Date  . Asthma     no inhaler -no problem as adult  . Environmental allergies    Past Surgical History  Procedure Laterality Date  . Cesarean section    . Corneal transplant      surgeries x 3, bilateral left x 2, right x 1  . Cholecystectomy    . Laparoscopy N/A 08/08/2012    Procedure: LAPAROSCOPY OPERATIVE;  Surgeon: Cheri Fowler, MD;  Location: [facility];  Service: Gynecology;  Laterality: N/A;  . Laparoscopic assisted vaginal hysterectomy N/A 01/18/2013    Procedure: LAPAROSCOPIC ASSISTED VAGINAL HYSTERECTOMY;  Surgeon: Cheri Fowler, MD;  Location: [facility];  Service: Gynecology;  Laterality: N/A;  . Bilateral salpingectomy Bilateral 01/18/2013    Procedure: BILATERAL SALPINGECTOMY;  Surgeon: Cheri Fowler, MD;  Location: [facility];  Service: Gynecology;  Laterality: Bilateral;   . Abdominal hysterectomy     Family History  Problem Relation Age of Onset  . Asthma Mother    History  Substance Use Topics  . Smoking status: Former Smoker -- 0.50 packs/day for 10 years    Types: Cigarettes    Quit date: 12/03/2012  . Smokeless tobacco: Never Used     Comment: quit smoking in approx. Sept 2014  . Alcohol Use: Yes     Comment: socially   OB History    Gravida Para Term Preterm AB TAB SAB Ectopic Multiple Living   1 1        1      Review of Systems  Constitutional: Positive for fever.  HENT: Positive for congestion and rhinorrhea.   Respiratory: Positive for cough.   Cardiovascular: Positive for chest pain.  Gastrointestinal: Negative for nausea and vomiting.  Musculoskeletal: Positive for myalgias.  Neurological: Positive for headaches.  A complete 10 system review of systems was obtained and all systems are negative except as noted in the HPI and PMH.   Allergies  Shellfish allergy; Aspirin; Other; and Tramadol  Home Medications   Prior to Admission medications   Medication Sig Start Date End Date Taking? Authorizing Provider  EPINEPHrine (EPIPEN 2-PAK) 0.3 mg/0.3 mL IJ SOAJ injection Inject 0.3 mLs (0.3 mg total) into the muscle once. 01/20/14   Carrie Mew, PA-C  HYDROcodone-acetaminophen (NORCO/VICODIN) 5-325 MG per tablet Take 1-2 tablets by mouth every 6 (six) hours as needed for moderate pain or severe pain. 11/16/13   Montine Circle, PA-C  prednisoLONE  acetate (PRED FORTE) 1 % ophthalmic suspension Place 1 drop into both eyes daily.    Historical Provider, MD  predniSONE (DELTASONE) 20 MG tablet Take 3 tablets (60 mg total) by mouth daily with breakfast. For 5 days. 01/20/14   Carrie Mew, PA-C  traMADol (ULTRAM) 50 MG tablet Take 50 mg by mouth every 6 (six) hours as needed for moderate pain.    Historical Provider, MD   Triage Vitals: BP 143/72 mmHg  Pulse 93  Temp(Src) 98.6 F (37 C) (Oral)  Resp 16  SpO2 100%  LMP  12/26/2012 Physical Exam  Constitutional: She is oriented to person, place, and time. She appears well-developed and well-nourished. No distress.  Obese   HENT:  Head: Normocephalic and atraumatic.  Mouth/Throat: Oropharynx is clear and moist.  Eyes: EOM are normal.  Neck: Normal range of motion. Neck supple. No tracheal deviation present.  Cardiovascular: Normal rate, regular rhythm and normal heart sounds.   No murmur heard. Pulmonary/Chest: Effort normal. No respiratory distress. She has wheezes.  Scattered moderate expiratory wheezes.   Abdominal: Soft. There is no tenderness.  Musculoskeletal: Normal range of motion.  Neurological: She is alert and oriented to person, place, and time.  Skin: Skin is warm and dry.  Psychiatric: She has a normal mood and affect. Her behavior is normal.  Nursing note and vitals reviewed.   ED Course  Procedures (including critical care time)  COORDINATION OF CARE: 4:22 PM- Discussed treatment plan with patient at bedside and patient agreed to plan.   Labs Review Labs Reviewed  CBC WITH DIFFERENTIAL/PLATELET - Abnormal; Notable for the following:    Lymphocytes Relative 11 (*)    Eosinophils Relative 7 (*)    All other components within normal limits  BASIC METABOLIC PANEL - Abnormal; Notable for the following:    Glucose, Bld 124 (*)    All other components within normal limits  I-STAT BETA HCG BLOOD, ED (MC, WL, AP ONLY) - Abnormal; Notable for the following:    I-stat hCG, quantitative 5.3 (*)    All other components within normal limits  I-STAT CG4 LACTIC ACID, ED    Imaging Review Dg Chest 2 View  04/18/2014   CLINICAL DATA:  Productive cough and fever for 2 weeks  EXAM: CHEST  2 VIEW  COMPARISON:  02/02/2013  FINDINGS: Cardiac shadow is stable. The lungs are well aerated bilaterally. No focal infiltrate or sizable effusion is seen. No bony abnormality is noted. An extrinsic lead is noted over the anterior aspect of the right third  rib. The previously seen tiny nodular density in the right upper lobe is not well appreciated on this exam.  IMPRESSION: No acute abnormality noted.   Electronically Signed   By: Inez Catalina M.D.   On: 04/18/2014 16:00     EKG Interpretation   Date/Time:  Thursday April 18 2014 15:34:10 EST Ventricular Rate:  89 PR Interval:  172 QRS Duration: 70 QT Interval:  343 QTC Calculation: 417 R Axis:   39 Text Interpretation:  Normal sinus rhythm Baseline wander in lead(s) I II  aVR Borderline T wave abnormalities No significant change since 2014  Confirmed by GOLDSTON  MD, [place] ([id number]) on 04/18/2014 4:01:44 PM      MDM   Final diagnoses:  Cough  Acute bronchitis, unspecified organism    Filed Vitals:   04/18/14 1525 04/18/14 1640  BP: 143/72 135/65  Pulse: 93 83  Temp: 98.6 F (37 C)   TempSrc: Oral  Resp: 16 14  SpO2: 100% 100%    Medications  albuterol (PROVENTIL) (2.5 MG/3ML) 0.083% nebulizer solution 5 mg (5 mg Nebulization Given 04/18/14 1640)  HYDROcodone-acetaminophen (NORCO/VICODIN) 5-325 MG per tablet 1 tablet (1 tablet Oral Given 04/18/14 1640)  predniSONE (DELTASONE) tablet 60 mg (60 mg Oral Given 04/18/14 1756)    Julie Munoz is a pleasant 39 y.o. female presenting with 2 weeks of cough with hemoptysis, pleuritic chest pain, myalgia, fever and chills. Lung sounds show expiratory wheezing, patient is a smoker. Lung sounds are improved after nebulizer treatment. X-ray is without infiltrate, patient's vital signs in the ED are without abnormality, I will treat her for a acute bronchitis exacerbation considering she is a tobacco smoker. We've had an extensive discussion of return precautions, counseled her on smoking cessation. Patient will be given Norco for myalgia and cough suppression. Triage initiated EKG is without acute abnormality. Patient states that she has both albuterol inhaler and albuterol nebulizer at home.  Evaluation does not show pathology that  would require ongoing emergent intervention or inpatient treatment. Pt is hemodynamically stable and mentating appropriately. Discussed findings and plan with patient/guardian, who agrees with care plan. All questions answered. Return precautions discussed and outpatient follow up given.   Discharge Medication List as of 04/18/2014  5:46 PM    START taking these medications   Details  azithromycin (ZITHROMAX Z-PAK) 250 MG tablet 2 po day one, then 1 daily x 4 days, Print         I personally performed the services described in this documentation, which was scribed in my presence. The recorded information has been reviewed and is accurate.   Monico Blitz, PA-C 04/18/14 1809  Monico Blitz, PA-C 04/18/14 1810  Orpah Greek, MD 04/19/14 [phone]

## 2014-06-07 ENCOUNTER — Encounter (HOSPITAL_COMMUNITY): Payer: Self-pay | Admitting: *Deleted

## 2014-06-07 ENCOUNTER — Emergency Department (HOSPITAL_COMMUNITY)
Admission: EM | Admit: 2014-06-07 | Discharge: 2014-06-07 | Disposition: A | Discharge status: Home / Self Care | Payer: Managed Care, Other (non HMO) | Attending: Emergency Medicine | Admitting: Emergency Medicine

## 2014-06-07 DIAGNOSIS — K0381 Cracked tooth: Secondary | ICD-10-CM | POA: Insufficient documentation

## 2014-06-07 DIAGNOSIS — Z7952 Long term (current) use of systemic steroids: Secondary | ICD-10-CM | POA: Insufficient documentation

## 2014-06-07 DIAGNOSIS — Z792 Long term (current) use of antibiotics: Secondary | ICD-10-CM | POA: Diagnosis not present

## 2014-06-07 DIAGNOSIS — Z87891 Personal history of nicotine dependence: Secondary | ICD-10-CM | POA: Diagnosis not present

## 2014-06-07 DIAGNOSIS — J45909 Unspecified asthma, uncomplicated: Secondary | ICD-10-CM | POA: Insufficient documentation

## 2014-06-07 DIAGNOSIS — K0889 Other specified disorders of teeth and supporting structures: Secondary | ICD-10-CM

## 2014-06-07 DIAGNOSIS — K088 Other specified disorders of teeth and supporting structures: Secondary | ICD-10-CM | POA: Insufficient documentation

## 2014-06-07 IMAGING — CR DG ANKLE COMPLETE 3+V*R*
3 series · 3 of 3 positions shown · non-contrast
Comparison: None.

CLINICAL DATA: Medial right ankle pain.

RIGHT ANKLE - COMPLETE 3+ VIEW

[x ankle ap right]
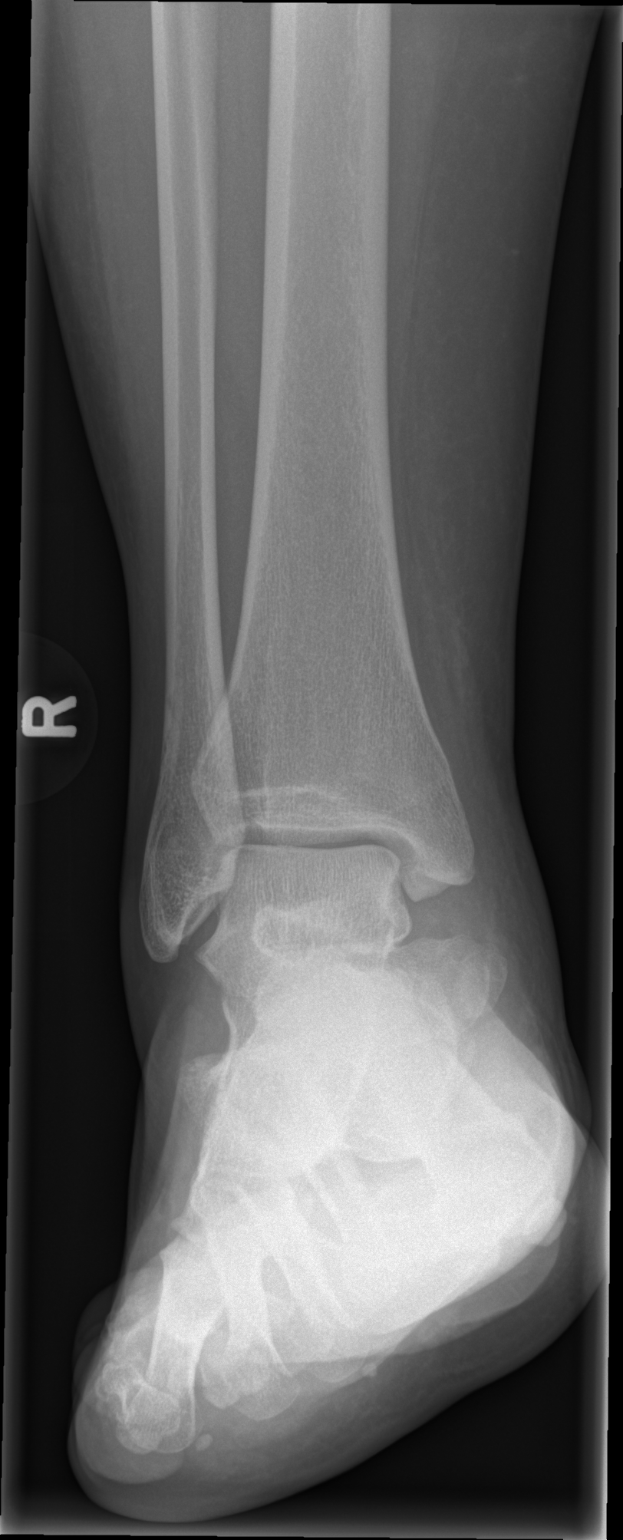

[x ankle obl right]
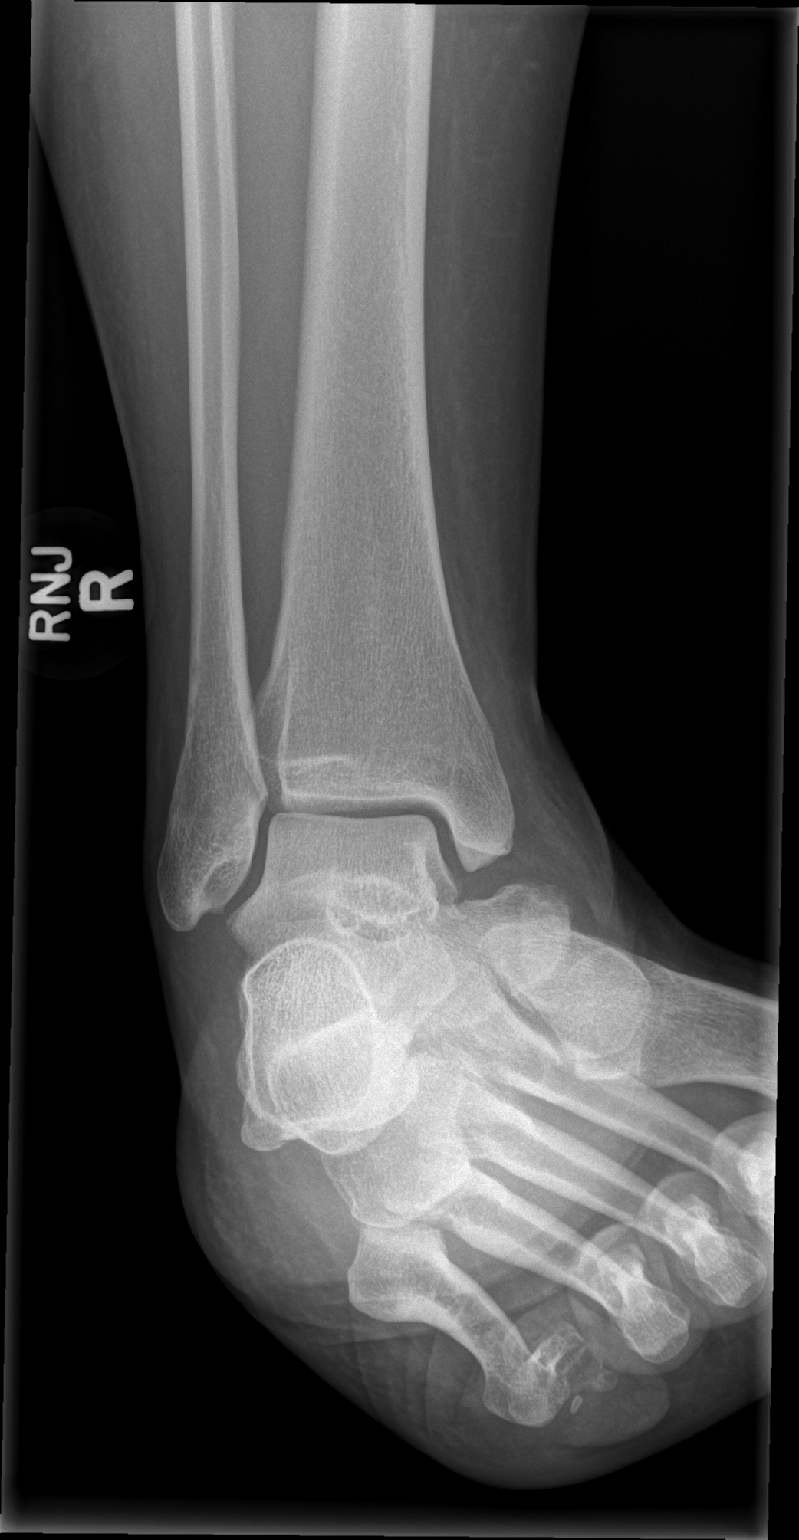

[x ankle lat right]
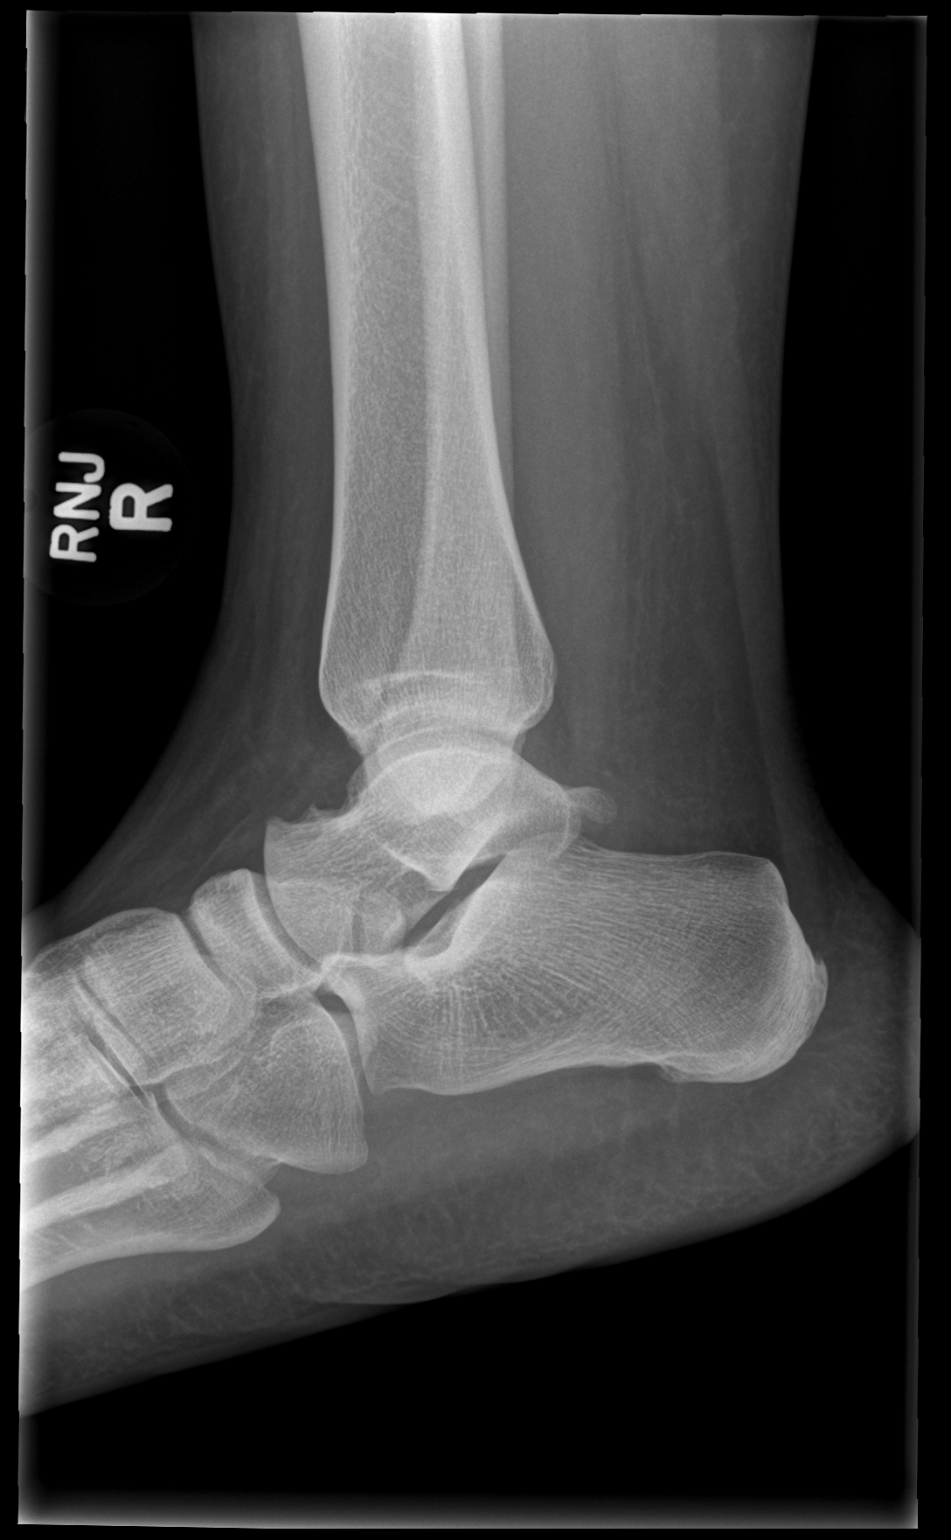

[3 of 3 positions shown; findings below may reference images not displayed]

FINDINGS: Osseous structures of the ankle are normal.  There is no
joint effusion.  No arthritis.
IMPRESSION: Normal exam.

## 2014-06-07 MED ORDER — HYDROCODONE-ACETAMINOPHEN 5-325 MG PO TABS: 1.0000 | ORAL_TABLET | ORAL | Status: DC | PRN

## 2014-06-07 MED ORDER — PENICILLIN V POTASSIUM 500 MG PO TABS: 500.0000 mg | ORAL_TABLET | Freq: Three times a day (TID) | ORAL | Status: DC

## 2014-06-07 NOTE — ED Notes (Signed)
See triage note.

## 2014-06-07 NOTE — Discharge Instructions (Signed)
Dental Pain °A tooth ache may be caused by cavities (tooth decay). Cavities expose the nerve of the tooth to air and hot or cold temperatures. It may come from an infection or abscess (also called a boil or furuncle) around your tooth. It is also often caused by dental caries (tooth decay). This causes the pain you are having. °DIAGNOSIS  °Your caregiver can diagnose this problem by exam. °TREATMENT  °· If caused by an infection, it may be treated with medications which kill germs (antibiotics) and pain medications as prescribed by your caregiver. Take medications as directed. °· Only take over-the-counter or prescription medicines for pain, discomfort, or fever as directed by your caregiver. °· Whether the tooth ache today is caused by infection or dental disease, you should see your dentist as soon as possible for further care. °SEEK MEDICAL CARE IF: °The exam and treatment you received today has been provided on an emergency basis only. This is not a substitute for complete medical or dental care. If your problem worsens or new problems (symptoms) appear, and you are unable to meet with your dentist, call or return to this location. °SEEK IMMEDIATE MEDICAL CARE IF:  °· You have a fever. °· You develop redness and swelling of your face, jaw, or neck. °· You are unable to open your mouth. °· You have severe pain uncontrolled by pain medicine. °MAKE SURE YOU:  °· Understand these instructions. °· Will watch your condition. °· Will get help right away if you are not doing well or get worse. °Document Released: 02/22/2005 Document Revised: 05/17/2011 Document Reviewed: 10/11/2007 °ExitCare® Patient Information ©2015 [employer]. This information is not intended to replace advice given to you by your health care provider. Make sure you discuss any questions you have with your health care provider. ° °Emergency Department Resource Guide °1) Find a Doctor and Pay Out of Pocket °Although you won't have to find out who  is covered by your insurance plan, it is a good idea to ask around and get recommendations. You will then need to call the office and see if the doctor you have chosen will accept you as a new patient and what types of options they offer for patients who are self-pay. Some doctors offer discounts or will set up payment plans for their patients who do not have insurance, but you will need to ask so you aren't surprised when you get to your appointment. ° °2) Contact Your Local Health Department °Not all health departments have doctors that can see patients for sick visits, but many do, so it is worth a call to see if yours does. If you don't know where your local health department is, you can check in your phone book. The CDC also has a tool to help you locate your state's health department, and many state websites also have listings of all of their local health departments. ° °3) Find a Walk-in Clinic °If your illness is not likely to be very severe or complicated, you may want to try a walk in clinic. These are popping up all over the country in pharmacies, drugstores, and shopping centers. They're usually staffed by nurse practitioners or physician assistants that have been trained to treat common illnesses and complaints. They're usually fairly quick and inexpensive. However, if you have serious medical issues or chronic medical problems, these are probably not your best option. ° °No Primary Care Doctor: °- Call [facility] at  832-8000 - they can help you locate a primary   care doctor that  accepts your insurance, provides certain services, etc. °- Physician Referral Service- 1-800-533-3463 ° °Chronic Pain Problems: °Organization         Address  Phone   Notes  °[facility]  ([phone]) [phone] Patients need to be referred by their primary care doctor.  ° °Medication Assistance: °Organization         Address  Phone   Notes  °[place] [facility] [address]  [address] °[place], [place] [address] [phone]) 641-8030 --Must be a resident of [place] °-- Must have NO insurance coverage whatsoever (no Medicaid/ Medicare, etc.) °-- The pt. MUST have a primary care doctor that directs their care regularly and follows them in the community °  °[facility]  (866) 331-1348   °[employer]  (888) 892-1162   ° °Agencies that provide inexpensive medical care: °Organization         Address  Phone   Notes  °[facility]  ([phone]) [phone]   °[facility]    (336) 832-7272   °[facility] [address] °[place], [place] [address] ([phone]) 832-4777   °[facility] [address], °[place] (336) [phone]   °[facility]    (336) 373-0678   °[facility]    (336) 272-1050   °[facility] ° [address], [place] Phone:  (336) [phone], Fax:  ([phone]) [phone] Hours of Operation:  9 am - 6 pm, M-F.  Also accepts Medicaid/Medicare and self-pay.  ° [facility] ° [address], [place] Phone: (336) [phone], Fax: ([phone]) [phone]. Hours of Operation:  8:30 am - 5:30 pm, M-F.  Also accepts Medicaid and self-pay.  °[facility] [place] [address], [place] Phone: ([phone]) [phone]   °[facility] [address], [place], [place] (336)723-1848, Ext. 123 Mondays & Thursdays: 7-9 AM.  First 15 patients are seen on a first come, first serve basis. °  ° °Medicaid-accepting [place] Providers: ° °Organization         Address  Phone   Notes  °[facility] [address], [place] ([phone]) [phone] Also accepts self-pay patients.  °[facility] [address], [place] ° (336) [phone]   °[facility] [address], [place] ([phone]) [phone]   °[facility] [address] [place] [address], [place] (336) [phone]   °[facility] [address], [place]  ° ([phone]) [phone] Only accepts [place] Access Medicaid patients after they have their name applied to their card.  ° °Self-Pay (no insurance) in [place]: ° °Organization         Address  Phone   Notes  °Sickle Cell Patients, [facility] [address], [place] (336) [phone]   °[facility] [address], [place] ([phone]) [phone]   °[facility] ° [address], [place] (336) [phone]   °[facility]/Dr. Osei-Bonsu ° [address] [place] [address], [place] or [address], [place] ([phone]) [phone] Phone number for both [place] and [place] locations is the same.  °[facility] [address], [place] ([phone]) [phone]   °[facility] [address] [place] [address], [place] or [address] (336) [phone] °(336) [phone]   °  [facility] [address], [place] (336) 350-1642, phone; (336) [phone], fax Sees patients 1st and 3rd Saturday of every month.  Must not qualify for public or private insurance (i.e. Medicaid, Medicare, [place] Health Choice, Veterans' Benefits) • Household income should be no more than 200% of the poverty level •The clinic cannot treat you if you are pregnant or think you are pregnant • Sexually transmitted diseases are not treated at the clinic.  ° ° °Dental Care: °Organization         Address  Phone  Notes  °[facility] [address], [place] (336) [phone] Accepts children up to age 21 who are enrolled in Medicaid or [place] Health Choice; pregnant women with a Medicaid card; and children who have applied for Medicaid or [place] Health Choice, but were declined, whose parents can pay a reduced fee at time of service.  °[facility] [place]  [address], [place] (336) 641-7733 Accepts children up to age 21 who are  enrolled in Medicaid or [place] Health Choice; pregnant women with a Medicaid card; and children who have applied for Medicaid or [place] Health Choice, but were declined, whose parents can pay a reduced fee at time of service.  °[facility] ° [address], [place] (336) [phone] Patients are seen by appointment only. Walk-ins are not accepted. [facility] will see patients 18 years of age and older. °Monday - Tuesday (8am-5pm) °Most Wednesdays (8:30-5pm) °$30 per visit, cash only  °[facility] ° [address], [place] ([phone]) [phone] Patients are seen by appointment only. Walk-ins are not accepted. [facility] will see patients 18 years of age and older. °One Wednesday Evening (Monthly: Volunteer Based).  $30 per visit, cash only  °[facility]  ([phone]) [phone] for adults; Children under age 4, call Graduate Pediatric Dentistry at ([phone]) [phone]. Children aged 4-14, please call ([phone]) [phone] to request a pediatric application. ° Dental services are provided in all areas of dental care including fillings, crowns and bridges, complete and partial dentures, implants, gum treatment, root canals, and extractions. Preventive care is also provided. Treatment is provided to both adults and children. °Patients are selected via a lottery and there is often a waiting list. °  °[facility] [address], °[place] ° (336) 763-8833 [email or web address] °  °[facility] [address], [place], [place] (336)723-1848, Ext. 123 Second and Fourth Thursday of each month, opens at 6:30 AM; Clinic ends at 9 AM.  Patients are seen on a first-come first-served basis, and a limited number are seen during each clinic.  ° °[facility] ° [address], [place], [place] ([phone]) [phone]   Eligibility Requirements °You must have lived in [place], [place], or [place] counties for at least the last three months. °  You  cannot be eligible for state or federal sponsored healthcare insurance, including Veterans Administration, Medicaid, or Medicare. °  You generally cannot be eligible for healthcare insurance through your employer.  °  How to apply: °Eligibility screenings are held every Tuesday and Wednesday afternoon from 1:00 pm until 4:00 pm. You do not need an appointment for the interview!  °[facility] [address], [place], [place] [phone]   °[facility]  [phone]   °[facility]  [phone]   °[facility]  [phone]   °  °

## 2014-06-07 NOTE — ED Provider Notes (Signed)
CSN: [id number]     Arrival date & time 06/07/14  1648 History  This chart was scribed for Charlann Lange, PA-C, working with Quintella Reichert, MD by Steva Colder, ED Scribe. The patient was seen in room WTR6/WTR6 at [facility].    Chief Complaint  Patient presents with  . Dental Pain     The history is provided by the patient. No language interpreter was used.    HPI Comments: Julie Munoz is a 39 y.o. female who presents to the Emergency Department complaining of dental pain onset last night. Pt notes that she broke her right upper tooth and there is pain to the area. pt notes that her filling fell out and then it eventually broke. Pt notes that the tooth is sharp and it is cutting her jaw. Pt does not have a dentist. She denies facial swelling, fever, chills and any other symptoms.   Past Medical History  Diagnosis Date  . Asthma     no inhaler -no problem as adult  . Environmental allergies    Past Surgical History  Procedure Laterality Date  . Cesarean section    . Corneal transplant      surgeries x 3, bilateral left x 2, right x 1  . Cholecystectomy    . Laparoscopy N/A 08/08/2012    Procedure: LAPAROSCOPY OPERATIVE;  Surgeon: Cheri Fowler, MD;  Location: [facility];  Service: Gynecology;  Laterality: N/A;  . Laparoscopic assisted vaginal hysterectomy N/A 01/18/2013    Procedure: LAPAROSCOPIC ASSISTED VAGINAL HYSTERECTOMY;  Surgeon: Cheri Fowler, MD;  Location: [facility];  Service: Gynecology;  Laterality: N/A;  . Bilateral salpingectomy Bilateral 01/18/2013    Procedure: BILATERAL SALPINGECTOMY;  Surgeon: Cheri Fowler, MD;  Location: [facility];  Service: Gynecology;  Laterality: Bilateral;  . Abdominal hysterectomy     Family History  Problem Relation Age of Onset  . Asthma Mother    History  Substance Use Topics  . Smoking status: Former Smoker -- 0.50 packs/day for 10 years    Types: Cigarettes    Quit date: 12/03/2012  . Smokeless tobacco: Never Used     Comment:  quit smoking in approx. Sept 2014  . Alcohol Use: Yes     Comment: socially   OB History    Gravida Para Term Preterm AB TAB SAB Ectopic Multiple Living   1 1        1      Review of Systems  Constitutional: Negative for fever and chills.  HENT: Positive for dental problem. Negative for facial swelling.       Allergies  Shellfish allergy; Aspirin; Other; and Tramadol  Home Medications   Prior to Admission medications   Medication Sig Start Date End Date Taking? Authorizing Provider  azithromycin (ZITHROMAX Z-PAK) 250 MG tablet 2 po day one, then 1 daily x 4 days 04/18/14   Elmyra Ricks Pisciotta, PA-C  EPINEPHrine (EPIPEN 2-PAK) 0.3 mg/0.3 mL IJ SOAJ injection Inject 0.3 mLs (0.3 mg total) into the muscle once. 01/20/14   Dahlia Bailiff, PA-C  HYDROcodone-acetaminophen (NORCO/VICODIN) 5-325 MG per tablet Take 1-2 tablets by mouth every 6 hours as needed for pain and/or cough. 04/18/14   Nicole Pisciotta, PA-C  prednisoLONE acetate (PRED FORTE) 1 % ophthalmic suspension Place 1 drop into both eyes daily.    Historical Provider, MD  predniSONE (DELTASONE) 50 MG tablet Take 1 tablet daily with breakfast 04/18/14   Elmyra Ricks Pisciotta, PA-C  traMADol (ULTRAM) 50 MG tablet Take 50 mg by mouth every 6 (  six) hours as needed for moderate pain.    Historical Provider, MD   BP 122/70 mmHg  Pulse 68  Temp(Src) 98.2 F (36.8 C) (Oral)  Resp 18  SpO2 100%  LMP 12/26/2012  Physical Exam  Constitutional: She is oriented to person, place, and time. She appears well-developed and well-nourished. No distress.  HENT:  Head: Normocephalic and atraumatic.  Mouth/Throat: No dental abscesses.  fracture to tooth number 4. No visible abscess and no facial swelling.   Eyes: EOM are normal.  Neck: Neck supple. No tracheal deviation present.  Cardiovascular: Normal rate.   Pulmonary/Chest: Effort normal. No respiratory distress.  Musculoskeletal: Normal range of motion.  Neurological: She is alert and oriented to  person, place, and time.  Skin: Skin is warm and dry.  Psychiatric: She has a normal mood and affect. Her behavior is normal.  Nursing note and vitals reviewed.   ED Course  Procedures (including critical care time) DIAGNOSTIC STUDIES: Oxygen Saturation is 100% on RA, normal by my interpretation.    COORDINATION OF CARE: 5:48 PM-Discussed treatment plan which includes referal to dentist, abx, and pain medication with pt at bedside and pt agreed to plan.   Labs Review Labs Reviewed - No data to display  Imaging Review No results found.   EKG Interpretation None      MDM   Final diagnoses:  None    1. Dental fracture  Will cover with abx and pain management. Referrals provided for dental follow up.  I personally performed the services described in this documentation, which was scribed in my presence. The recorded information has been reviewed and is accurate.     Charlann Lange, PA-C 06/07/14 [place], MD 06/07/14 [phone]

## 2014-06-07 NOTE — ED Notes (Signed)
Pt reports broken R upper tooth.  Reports pain.

## 2015-01-19 ENCOUNTER — Emergency Department (HOSPITAL_COMMUNITY)
Admission: EM | Admit: 2015-01-19 | Discharge: 2015-01-19 | Disposition: A | Discharge status: Home / Self Care | Payer: Managed Care, Other (non HMO) | Attending: Physician Assistant | Admitting: Physician Assistant

## 2015-01-19 ENCOUNTER — Encounter (HOSPITAL_COMMUNITY): Payer: Self-pay | Admitting: Emergency Medicine

## 2015-01-19 DIAGNOSIS — M79672 Pain in left foot: Secondary | ICD-10-CM

## 2015-01-19 DIAGNOSIS — Z7952 Long term (current) use of systemic steroids: Secondary | ICD-10-CM | POA: Insufficient documentation

## 2015-01-19 DIAGNOSIS — Z79899 Other long term (current) drug therapy: Secondary | ICD-10-CM | POA: Insufficient documentation

## 2015-01-19 DIAGNOSIS — J45909 Unspecified asthma, uncomplicated: Secondary | ICD-10-CM | POA: Insufficient documentation

## 2015-01-19 DIAGNOSIS — L84 Corns and callosities: Secondary | ICD-10-CM

## 2015-01-19 DIAGNOSIS — Z87891 Personal history of nicotine dependence: Secondary | ICD-10-CM | POA: Insufficient documentation

## 2015-01-19 DIAGNOSIS — Z792 Long term (current) use of antibiotics: Secondary | ICD-10-CM | POA: Insufficient documentation

## 2015-01-19 MED ORDER — HYDROCODONE-ACETAMINOPHEN 5-325 MG PO TABS: 1.0000 | ORAL_TABLET | Freq: Four times a day (QID) | ORAL | Status: DC | PRN

## 2015-01-19 MED ORDER — NAPROXEN 500 MG PO TABS: 500.0000 mg | ORAL_TABLET | Freq: Two times a day (BID) | ORAL | Status: DC | PRN

## 2015-01-19 NOTE — Discharge Instructions (Signed)
Use corn cushions to help protect the area. Soak and use a pumice stone to remove hardened skin from the surface. Use naprosyn and norco as directed as needed for pain but don't drive while taking norco. Find shoes that fit better and have more cushion in the soles. Follow up with the podiatrist for ongoing management. Return to the ER for changes or worsening symptoms.    Corns and Calluses Corns are small areas of thickened skin that occur on the top, sides, or tip of a toe. They contain a cone-shaped core with a point that can press on a nerve below. This causes pain. Calluses are areas of thickened skin that can occur anywhere on the body including hands, fingers, palms, soles of the feet, and heels.Calluses are usually larger than corns.  CAUSES  Corns and calluses are caused by rubbing (friction) or pressure, such as from shoes that are too tight or do not fit properly.  RISK FACTORS Corns are more likely to develop in people who have toe deformities, such as hammer toes. Since calluses can occur with friction to any area of the skin, calluses are more likely to develop in people who:   Work with their hands.  Wear shoes that fit poorly, shoes that are too tight, or shoes that are high-heeled.  Have toes deformities. SYMPTOMS Symptoms of a corn or callus include:  A hard growth on the skin.   Pain or tenderness under the skin.   Redness and swelling.   Increased discomfort while wearing tight-fitting shoes. DIAGNOSIS  Corns and calluses may be diagnosed with a medical history and physical exam.  TREATMENT  Corns and calluses may be treated with:  Removing the cause of the friction or pressure. This may include:  Changing your shoes.  Wearing shoe inserts (orthotics) or other protective layers in your shoes, such as a corn pad.  Wearing gloves.  Medicines to help soften skin in the hardened, thickened areas.  Reducing the size of the corn or callus by removing the  dead layers of skin.  Antibiotic medicines to treat infection.  Surgery, if a toe deformity is the cause. HOME CARE INSTRUCTIONS   Take medicines only as directed by your health care provider.  If you were prescribed an antibiotic, finish all of it even if you start to feel better.  Wear shoes that fit well. Avoid wearing high-heeled shoes and shoes that are too tight or too loose.  Wear any padding, protective layers, gloves, or orthotics as directed by your health care provider.  Soak your hands or feet and then use a file or pumice stone to soften your corn or callus. Do this as directed by your health care provider.  Check your corn or callus every day for signs of infection. Watch for:  Redness, swelling, or pain.  Fluid, blood, or pus. SEEK MEDICAL CARE IF:   Your symptoms do not improve with treatment.  You have increased redness, swelling, or pain at the site of your corn or callus.  You have fluid, blood, or pus coming from your corn or callus.  You have new symptoms.   This information is not intended to replace advice given to you by your health care provider. Make sure you discuss any questions you have with your health care provider.   Document Released: 11/29/2003 Document Revised: 07/09/2014 Document Reviewed: 02/18/2014 Elsevier Interactive Patient Education [employer].

## 2015-01-19 NOTE — ED Notes (Signed)
Patient states has had a "hard place on the bottom of my foot for 6 months".  Patient states she sometimes "takes the hard top layer off and it feels better.  Now the pain runs up my leg".  Complains of L foot pain.

## 2015-01-19 NOTE — ED Provider Notes (Signed)
CSN: [id number]     Arrival date & time 01/19/15  [id number] History   First MD Initiated Contact with Patient 01/19/15 [phone]     Chief Complaint  Patient presents with  . Foot Pain     (Consider location/radiation/quality/duration/timing/severity/associated sxs/prior Treatment) HPI Comments: Julie Munoz is a 39 y.o. female with a PMHx of asthma, who presents to the ED with complaints of left foot pain 6 months. Patient states that she has a callus on the bottom of her left foot near the pinky toe. 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 She describes the pain is 5/10 at rest and 10/10 with walking, located along the lateral aspect of her left foot, throbbing in nature, radiating into her leg, constant, worse with walking, relieved with warm soaks and Vicodin, and unrelieved with Tylenol. Associated symptoms included tingling in the fourth and fifth toes of the left foot. She denies any fevers, chills, chest pain, shortness breath, abdominal pain, nausea, vomiting, diarrhea, constipation, dysuria, hematuria, erythema, warmth, drainage, numbness, or weakness.  Patient is a 39 y.o. female presenting with lower extremity pain. The history is provided by the patient. No language interpreter was used.  Foot Pain This is a new problem. The current episode started more than 1 month ago. The problem occurs constantly. The problem has been unchanged. Associated symptoms include arthralgias. Pertinent negatives include no abdominal pain, chest pain, chills, fever, myalgias, nausea, numbness, urinary symptoms, vomiting or weakness. The symptoms are aggravated by walking. She has tried oral narcotics, acetaminophen and heat for the symptoms. The treatment provided mild relief.    Past Medical History  Diagnosis Date  . Asthma     no inhaler -no problem as adult  . Environmental allergies    Past Surgical  History  Procedure Laterality Date  . Cesarean section    . Corneal transplant      surgeries x 3, bilateral left x 2, right x 1  . Cholecystectomy    . Laparoscopy N/A 08/08/2012    Procedure: LAPAROSCOPY OPERATIVE;  Surgeon: Cheri Fowler, MD;  Location: [facility];  Service: Gynecology;  Laterality: N/A;  . Laparoscopic assisted vaginal hysterectomy N/A 01/18/2013    Procedure: LAPAROSCOPIC ASSISTED VAGINAL HYSTERECTOMY;  Surgeon: Cheri Fowler, MD;  Location: [facility];  Service: Gynecology;  Laterality: N/A;  . Bilateral salpingectomy Bilateral 01/18/2013    Procedure: BILATERAL SALPINGECTOMY;  Surgeon: Cheri Fowler, MD;  Location: [facility];  Service: Gynecology;  Laterality: Bilateral;  . Abdominal hysterectomy     Family History  Problem Relation Age of Onset  . Asthma Mother    Social History  Substance Use Topics  . Smoking status: Former Smoker -- 0.50 packs/day for 10 years    Types: Cigarettes    Quit date: 12/03/2012  . Smokeless tobacco: Never Used     Comment: quit smoking in approx. Sept 2014  . Alcohol Use: Yes     Comment: socially   OB History    Gravida Para Term Preterm AB TAB SAB Ectopic Multiple Living   1 1        1      Review of Systems  Constitutional: Negative for fever and chills.  Respiratory: Negative for shortness of breath.   Cardiovascular: Negative for chest pain.  Gastrointestinal: Negative for nausea, vomiting, abdominal pain, diarrhea and constipation.  Genitourinary: Negative for dysuria and hematuria.  Musculoskeletal: Positive for arthralgias. Negative for myalgias.  Skin: Positive for wound (corn of L foot). Negative for color change.  Allergic/Immunologic: Negative for immunocompromised state.  Neurological: Negative for weakness and numbness.       +tingling L 4th-5th toes  Psychiatric/Behavioral: Negative for confusion.   10 Systems reviewed and are negative for acute change except as noted in the HPI.    Allergies  Shellfish  allergy; Aspirin; Other; and Tramadol  Home Medications   Prior to Admission medications   Medication Sig Start Date End Date Taking? Authorizing Provider  azithromycin (ZITHROMAX Z-PAK) 250 MG tablet 2 po day one, then 1 daily x 4 days 04/18/14   Elmyra Ricks Pisciotta, PA-C  EPINEPHrine (EPIPEN 2-PAK) 0.3 mg/0.3 mL IJ SOAJ injection Inject 0.3 mLs (0.3 mg total) into the muscle once. 01/20/14   Dahlia Bailiff, PA-C  HYDROcodone-acetaminophen (NORCO/VICODIN) 5-325 MG per tablet Take 1-2 tablets by mouth every 4 (four) hours as needed. 06/07/14   Charlann Lange, PA-C  penicillin v potassium (VEETID) 500 MG tablet Take 1 tablet (500 mg total) by mouth 3 (three) times daily. 06/07/14   Charlann Lange, PA-C  prednisoLONE acetate (PRED FORTE) 1 % ophthalmic suspension Place 1 drop into both eyes daily.    Historical Provider, MD  predniSONE (DELTASONE) 50 MG tablet Take 1 tablet daily with breakfast 04/18/14   Elmyra Ricks Pisciotta, PA-C  traMADol (ULTRAM) 50 MG tablet Take 50 mg by mouth every 6 (six) hours as needed for moderate pain.    Historical Provider, MD   BP 148/84 mmHg  Pulse 73  Temp(Src) 97.3 F (36.3 C) (Oral)  Resp 18  Ht 5\' 7"  (1.702 m)  Wt 205 lb (92.987 kg)  BMI 32.10 kg/m2  SpO2 99%  LMP 12/26/2012 Physical Exam  Constitutional: She is oriented to person, place, and time. Vital signs are normal. She appears well-developed and well-nourished.  Non-toxic appearance. No distress.  Afebrile, nontoxic, NAD  HENT:  Head: Normocephalic and atraumatic.  Mouth/Throat: Mucous membranes are normal.  Eyes: Conjunctivae and EOM are normal. Right eye exhibits no discharge. Left eye exhibits no discharge.  Neck: Normal range of motion. Neck supple.  Cardiovascular: Normal rate and intact distal pulses.   Pulmonary/Chest: Effort normal. No respiratory distress.  Abdominal: Normal appearance. She exhibits no distension.  Musculoskeletal: Normal range of motion.       Left foot: There is tenderness. There  is normal range of motion, no bony tenderness, no swelling, normal capillary refill and no deformity.  L foot with FROM intact in all digits, no swelling or deformity, with a corn to plantar surface, just underneath the 5th MTP joint, which is mildly TTP without focal bony tenderness. Strength and sensation grossly intact, distal pulses intact. No drainage or erythema, no warmth.  Neurological: She is alert and oriented to person, place, and time. She has normal strength. No sensory deficit.  Skin: Skin is warm, dry and intact. No rash noted.  Corn to plantar surface of L foot, just underneath the 5th MTP joint  Psychiatric: She has a normal mood and affect. Her behavior is normal.  Nursing note and vitals reviewed.   ED Course  Procedures (including critical care time) Labs Review Labs Reviewed - No data to display  Imaging Review No results found. I have personally reviewed and evaluated these images and lab results as part of my medical decision-making.   EKG Interpretation None      MDM   Final diagnoses:  Callus of foot  Corn of foot  Foot pain, left    39 y.o. female here with corn of foot, extremities NVI with soft compartments, no bony tenderness, tender to corn area. Discussed corn cushions and better fitting shoes. Warm soaks and pumice stone discussed. Will give pain medicine at her request, but discussed ultimately that podiatry would need to treat the area, likely excision. Will refer there. I explained the diagnosis and have given explicit precautions to return to the ER including for any other new or worsening symptoms. The patient understands and accepts the medical plan as it's been dictated and I have answered their questions. Discharge instructions concerning home care and prescriptions have been given. The patient is STABLE and is discharged to home in good condition.  BP 148/84 mmHg  Pulse 73  Temp(Src) 97.3 F (36.3 C) (Oral)  Resp 18  Ht 5\' 7"  (1.702 m)   Wt 205 lb (92.987 kg)  BMI 32.10 kg/m2  SpO2 99%  LMP 12/26/2012  Meds ordered this encounter  Medications  . HYDROcodone-acetaminophen (NORCO) 5-325 MG tablet    Sig: Take 1 tablet by mouth every 6 (six) hours as needed for severe pain.    Dispense:  10 tablet    Refill:  0    Order Specific Question:  Supervising Provider    Answer:  Sabra Heck, BRIAN [[id number]]  . naproxen (NAPROSYN) 500 MG tablet    Sig: Take 1 tablet (500 mg total) by mouth 2 (two) times daily as needed for mild pain, moderate pain or headache (TAKE WITH MEALS.).    Dispense:  20 tablet    Refill:  0    Order Specific Question:  Supervising Provider    Answer:  Noemi Chapel [[id number]]       Remona Boom Camprubi-Soms, PA-C 01/19/15 [place], MD 01/19/15 [phone]

## 2016-01-09 ENCOUNTER — Emergency Department (HOSPITAL_COMMUNITY): Payer: BLUE CROSS/BLUE SHIELD

## 2016-01-09 ENCOUNTER — Encounter (HOSPITAL_COMMUNITY): Payer: Self-pay

## 2016-01-09 ENCOUNTER — Emergency Department (HOSPITAL_COMMUNITY)
Admission: EM | Admit: 2016-01-09 | Discharge: 2016-01-09 | Disposition: A | Discharge status: Home / Self Care | Payer: BLUE CROSS/BLUE SHIELD | Attending: Emergency Medicine | Admitting: Emergency Medicine

## 2016-01-09 DIAGNOSIS — Z87891 Personal history of nicotine dependence: Secondary | ICD-10-CM | POA: Diagnosis not present

## 2016-01-09 DIAGNOSIS — J181 Lobar pneumonia, unspecified organism: Secondary | ICD-10-CM | POA: Insufficient documentation

## 2016-01-09 DIAGNOSIS — J45909 Unspecified asthma, uncomplicated: Secondary | ICD-10-CM | POA: Insufficient documentation

## 2016-01-09 DIAGNOSIS — R05 Cough: Secondary | ICD-10-CM | POA: Diagnosis present

## 2016-01-09 DIAGNOSIS — J189 Pneumonia, unspecified organism: Secondary | ICD-10-CM

## 2016-01-09 LAB — CBC
HCT: 39.1 % (ref 36.0–46.0)
HEMOGLOBIN: 13.2 g/dL (ref 12.0–15.0)
MCH: 31.8 pg (ref 26.0–34.0)
MCHC: 33.8 g/dL (ref 30.0–36.0)
MCV: 94.2 fL (ref 78.0–100.0)
PLATELETS: 265 10*3/uL (ref 150–400)
RBC: 4.15 MIL/uL (ref 3.87–5.11)
RDW: 14 % (ref 11.5–15.5)
WBC: 6.6 10*3/uL (ref 4.0–10.5)

## 2016-01-09 LAB — BASIC METABOLIC PANEL
Anion gap: 7 (ref 5–15)
BUN: 10 mg/dL (ref 6–20)
CALCIUM: 8.7 mg/dL — AB (ref 8.9–10.3)
CHLORIDE: 107 mmol/L (ref 101–111)
CO2: 25 mmol/L (ref 22–32)
CREATININE: 0.79 mg/dL (ref 0.44–1.00)
GFR calc non Af Amer: >60 mL/min (ref >60)
Glucose, Bld: 103 mg/dL — ABNORMAL HIGH (ref 65–99)
Potassium: 3.9 mmol/L (ref 3.5–5.1)
Sodium: 139 mmol/L (ref 135–145)

## 2016-01-09 LAB — I-STAT TROPONIN, ED: Troponin i, poc: 0 ng/mL (ref 0.00–0.08)

## 2016-01-09 LAB — RAPID STREP SCREEN (MED CTR MEBANE ONLY): Streptococcus, Group A Screen (Direct): NEGATIVE

## 2016-01-09 MED ORDER — AZITHROMYCIN 250 MG PO TABS
500.0000 mg | ORAL_TABLET | Freq: Once | ORAL | Status: AC
  Administered 2016-01-09: 500 mg via ORAL
  Filled 2016-01-09: qty 2

## 2016-01-09 MED ORDER — GUAIFENESIN-CODEINE 100-10 MG/5ML PO SOLN: 5.0000 mL | Freq: Three times a day (TID) | ORAL | 0 refills | Status: DC | PRN

## 2016-01-09 MED ORDER — KETOROLAC TROMETHAMINE 30 MG/ML IJ SOLN
30.0000 mg | Freq: Once | INTRAMUSCULAR | Status: AC
  Administered 2016-01-09: 30 mg via INTRAMUSCULAR
  Filled 2016-01-09: qty 1

## 2016-01-09 MED ORDER — AZITHROMYCIN 250 MG PO TABS: 250.0000 mg | ORAL_TABLET | Freq: Every day | ORAL | 0 refills | Status: DC

## 2016-01-09 MED ORDER — IPRATROPIUM-ALBUTEROL 0.5-2.5 (3) MG/3ML IN SOLN
3.0000 mL | Freq: Once | RESPIRATORY_TRACT | Status: AC
Start: 2016-01-09 — End: 2016-01-09
  Administered 2016-01-09: 3 mL via RESPIRATORY_TRACT
  Filled 2016-01-09: qty 3

## 2016-01-09 NOTE — Discharge Instructions (Signed)
Please take all of your antibiotics until finished! Follow up with your doctor in regards to your hospital visit today. Use cough syrup at night as needed for cough. Return to the emergency department if symptoms worsen, become progressive, or become more concerning.  Pneumonia, Adult Pneumonia is an infection of the lungs.   CAUSES Pneumonia may be caused by bacteria or a virus. Usually, these infections are caused by breathing infectious particles into the lungs (respiratory tract).  SYMPTOMS  Cough.  Fever.  Chest pain.  Increased rate of breathing.  Wheezing.  Mucus production.   HOME CARE INSTRUCTIONS  Cough suppressants may be used if you are losing too much rest. However, coughing protects you by clearing your lungs. You should avoid using cough suppressants if you can.  Your caregiver may have prescribed medicine if he or she thinks your pneumonia is caused by a bacteria or influenza. Finish your medicine even if you start to feel better.  Your caregiver may also prescribe an expectorant. This loosens the mucus to be coughed up.  Only take over-the-counter or prescription medicines for pain, discomfort, or fever as directed by your caregiver.  Do not smoke. Smoking is a common cause of bronchitis and can contribute to pneumonia. If you are a smoker and continue to smoke, your cough may last several weeks after your pneumonia has cleared.  A cold steam vaporizer or humidifier in your room or home may help loosen mucus.  Coughing is often worse at night. Sleeping in a semi-upright position in a recliner or using a couple pillows under your head will help with this.  Get rest as you feel it is needed. Your body will usually let you know when you need to rest.   SEEK IMMEDIATE MEDICAL CARE IF:  Your illness becomes worse. This is especially true if you are elderly or weakened from any other disease.  You cannot control your cough with suppressants and are losing sleep.  You begin  coughing up blood.  You develop pain which is getting worse or is uncontrolled with medicines.  You have a fever.  Any of the symptoms which initially brought you in for treatment are getting worse rather than better.  You develop shortness of breath or chest pain.   MAKE SURE YOU:  Understand these instructions.  Will watch your condition.  Will get help right away if you are not doing well or get worse.

## 2016-01-09 NOTE — ED Provider Notes (Signed)
[facility] DEPT Provider Note   CSN: [id number] Arrival date & time: 01/09/16  1609     History   Chief Complaint Chief Complaint  Patient presents with  . Headache  . Cough  . Chest Pain    HPI Julie Munoz is a 40 y.o. female.  The history is provided by the patient and medical records. No language interpreter was used.  Headache   Associated symptoms include a fever and shortness of breath. Pertinent negatives include no palpitations, no nausea and no vomiting.  Cough  Associated symptoms include chest pain, headaches, sore throat, shortness of breath and wheezing.  Chest Pain   Associated symptoms include cough, a fever, headaches and shortness of breath. Pertinent negatives include no abdominal pain, no back pain, no nausea, no palpitations and no vomiting.   Julie Munoz is a 40 y.o. female  with a PMH of asthma who presents to the Emergency Department complaining of persistent cough productive of yellow sputum associated with shortness of breath, sore throat, nasal congestion, headache x 2-3 days. + subjective fever. Today, she started having chest wall pain which she believes is due to coughing so much for the last few days. She had a episode of coughing fits which led to posttussive emesis today around 10:30 this morning with approximately 2 tsp of blood - no further emesis or blood noticed. She used her albuterol inhaler at home with little relief. No other medications taken prior to arrival for symptoms. No back pain, abdominal pain, n/v/d. No sick contacts.   Past Medical History:  Diagnosis Date  . Asthma    no inhaler -no problem as adult  . Environmental allergies     Patient Active Problem List   Diagnosis Date Noted  . Pulmonary nodule 02/02/2013  . CAP (community acquired pneumonia) 02/02/2013  . Fibroid uterus 01/18/2013  . 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 Pelvic pain in female 08/08/2012    Past Surgical History:  Procedure  Laterality Date  . ABDOMINAL HYSTERECTOMY    . BILATERAL SALPINGECTOMY Bilateral 01/18/2013   Procedure: BILATERAL SALPINGECTOMY;  Surgeon: Cheri Fowler, MD;  Location: [facility];  Service: Gynecology;  Laterality: Bilateral;  . CESAREAN SECTION    . CHOLECYSTECTOMY    . CORNEAL TRANSPLANT     surgeries x 3, bilateral left x 2, right x 1  . LAPAROSCOPIC ASSISTED VAGINAL HYSTERECTOMY N/A 01/18/2013   Procedure: LAPAROSCOPIC ASSISTED VAGINAL HYSTERECTOMY;  Surgeon: Cheri Fowler, MD;  Location: [facility];  Service: Gynecology;  Laterality: N/A;  . LAPAROSCOPY N/A 08/08/2012   Procedure: LAPAROSCOPY OPERATIVE;  Surgeon: Cheri Fowler, MD;  Location: [facility];  Service: Gynecology;  Laterality: N/A;    OB History    Gravida Para Term Preterm AB Living   1 1       1    SAB TAB Ectopic Multiple Live Births                   Home Medications    Prior to Admission medications   Medication Sig Start Date End Date Taking? Authorizing Provider  albuterol (PROVENTIL HFA;VENTOLIN HFA) 108 (90 Base) MCG/ACT inhaler Inhale 1-2 puffs into the lungs every 6 (six) hours as needed for wheezing or shortness of breath.   Yes Historical Provider, MD  albuterol (PROVENTIL) (2.5 MG/3ML) 0.083% nebulizer solution Take 2.5 mg by nebulization every 6 (six) hours as needed for wheezing or shortness of breath.   Yes Historical Provider, MD  DM-Phenylephrine-Acetaminophen (VICKS DAYQUIL COLD & FLU)  10-5-325 MG/15ML LIQD Take 15 mLs by mouth daily as needed (for cold).   Yes Historical Provider, MD  EPINEPHrine (EPIPEN 2-PAK) 0.3 mg/0.3 mL IJ SOAJ injection Inject 0.3 mLs (0.3 mg total) into the muscle once. 01/20/14  Yes Dahlia Bailiff, PA-C  oxyCODONE-acetaminophen (PERCOCET) 10-325 MG tablet Take 2 tablets by mouth every 4 (four) hours as needed for pain.   Yes Historical Provider, MD  prednisoLONE acetate (PRED FORTE) 1 % ophthalmic suspension Place 1 drop into both eyes daily.   Yes Historical Provider, MD  azithromycin  (ZITHROMAX) 250 MG tablet Take 1 tablet (250 mg total) by mouth daily. 01/09/16   Jennefer Kopp Pilcher Ileah Falkenstein, PA-C  guaiFENesin-codeine 100-10 MG/5ML syrup Take 5 mLs by mouth 3 (three) times daily as needed for cough. 01/09/16   Ozella Almond Saadiq Poche, PA-C    Family History Family History  Problem Relation Age of Onset  . Asthma Mother     Social History Social History  Substance Use Topics  . Smoking status: Former Smoker    Packs/day: 0.50    Years: 10.00    Types: Cigarettes    Quit date: 12/03/2012  . Smokeless tobacco: Never Used     Comment: quit smoking in approx. Sept 2014  . Alcohol use Yes     Comment: socially     Allergies   Iodine; Shellfish allergy; Other; and Tramadol   Review of Systems Review of Systems  Constitutional: Positive for fever.  HENT: Positive for congestion and sore throat.   Eyes: Negative for visual disturbance.  Respiratory: Positive for cough, shortness of breath and wheezing.   Cardiovascular: Positive for chest pain. Negative for palpitations and leg swelling.  Gastrointestinal: Negative for abdominal pain, nausea and vomiting.  Genitourinary: Negative for dysuria.  Musculoskeletal: Negative for back pain and neck pain.  Skin: Negative for rash.  Neurological: Positive for headaches.     Physical Exam Updated Vital Signs BP 130/72 (BP Location: Right Arm)   Pulse 81   Temp 99 F (37.2 C) (Oral)   Resp 24   Ht 5\' 7"  (1.702 m)   Wt 111.1 kg   LMP 12/26/2012   SpO2 100%   BMI 38.37 kg/m   Physical Exam  Constitutional: She is oriented to person, place, and time. She appears well-developed and well-nourished. No distress.  HENT:  Head: Normocephalic and atraumatic.  Oropharynx with erythema, no exudates or tonsillar hypertrophy. No focal areas of sinus tenderness.  Cardiovascular: Normal rate, regular rhythm and normal heart sounds.   No murmur heard. Pulmonary/Chest: Effort normal. No respiratory distress. She has no rales. She  exhibits no tenderness.  Faint expiratory wheezing bilaterally. Speaking in full sentences and 100% O2 on room air.  Abdominal: Soft. She exhibits no distension. There is no tenderness.  Musculoskeletal: She exhibits no edema.  Neurological: She is alert and oriented to person, place, and time.  Skin: Skin is warm and dry. Capillary refill takes less than 2 seconds.  Nursing note and vitals reviewed.    ED Treatments / Results  Labs (all labs ordered are listed, but only abnormal results are displayed) Labs Reviewed  BASIC METABOLIC PANEL - Abnormal; Notable for the following:       Result Value   Glucose, Bld 103 (*)    Calcium 8.7 (*)    All other components within normal limits  RAPID STREP SCREEN (NOT AT [facility])  CULTURE, GROUP A STREP ([place])  CBC  I-STAT TROPOININ, ED    EKG  EKG  Interpretation  Date/Time:  Friday January 09 2016 16:36:30 EDT Ventricular Rate:  80 PR Interval:    QRS Duration: 86 QT Interval:  374 QTC Calculation: 432 R Axis:   56 Text Interpretation:  Sinus rhythm Low voltage, precordial leads Borderline T wave abnormalities Baseline wander in lead(s) II III aVF V2 V6 When compared with ECG of 04/18/2014, No significant change was found Confirmed by [facility]  MD, DAVID ([id number]) on 01/09/2016 7:37:12 PM       Radiology Dg Chest 2 View  Result Date: 01/09/2016 CLINICAL DATA:  Shortness of breath cough and chills EXAM: CHEST  2 VIEW COMPARISON:  04/18/2014 FINDINGS: There is a streaky opacity in the left lower lobe suspicious for a mild infiltrate. No effusion. Heart size normal. No pneumothorax. IMPRESSION: Mild left lower lobe infiltrate Electronically Signed   By: Donavan Foil M.D.   On: 01/09/2016 17:21    Procedures Procedures (including critical care time)  Medications Ordered in ED Medications  ipratropium-albuterol (DUONEB) 0.5-2.5 (3) MG/3ML nebulizer solution 3 mL (3 mLs Nebulization Given 01/09/16 1836)  azithromycin (ZITHROMAX) tablet 500  mg (500 mg Oral Given 01/09/16 1844)  ketorolac (TORADOL) 30 MG/ML injection 30 mg (30 mg Intramuscular Given 01/09/16 1844)     Initial Impression / Assessment and Plan / ED Course  I have reviewed the triage vital signs and the nursing notes.  Pertinent labs & imaging results that were available during my care of the patient were reviewed by me and considered in my medical decision making (see chart for details).  Clinical Course   Julie Munoz is a 40 y.o. female who presents to ED for productive cough, congestion, headache, chest pain 2-3 days. On exam, patient is afebrile, nontoxic appearing and hemodynamically stable. Faint expiratory wheezing bilaterally-DuoNeb given. Toradol given for headache.  CBC with normal white count. BMP and troponin are reassuring. Rapid strep obtained at triage negative. EKG unchanged from previous tracings.  Chest x-ray shows ? left lower lobe infiltrate.   Patient reevaluated and wheezing has improved. Headache resolved with Toradol.  Will treat PNA with azithromycin. Rx for cough suppressant. PCP follow-up strongly encouraged. Reasons to return to the ED were discussed and all questions were answered.  Final Clinical Impressions(s) / ED Diagnoses   Final diagnoses:  Community acquired pneumonia of left lower lobe of lung (HCC)    New Prescriptions New Prescriptions   AZITHROMYCIN (ZITHROMAX) 250 MG TABLET    Take 1 tablet (250 mg total) by mouth daily.   GUAIFENESIN-CODEINE 100-10 MG/5ML SYRUP    Take 5 mLs by mouth 3 (three) times daily as needed for cough.     [facility] Emmalia Heyboer, PA-C [id number]    Delora Fuel, MD [id number] [phone]

## 2016-01-09 NOTE — ED Triage Notes (Signed)
Pt states that for the last 2-3 days she has been experiencing SOB, cough, chills, and sore throat. Pt states that today, she began to have a headache with some nausea due to coughing. Pt reports vomiting a small amount of bright red blood today around 1030 after straining and when "there was nothing else left to come up." Pt also endorsing chest pain that she describes as intermittent and shooting. A&Ox4. Ambulatory. No audible wheezing in triage.

## 2016-01-09 NOTE — ED Notes (Signed)
Contacted respiratory for breathing treatment

## 2016-01-12 LAB — CULTURE, GROUP A STREP (THRC)

## 2018-01-05 ENCOUNTER — Encounter (HOSPITAL_COMMUNITY): Payer: Self-pay | Admitting: *Deleted

## 2018-01-05 ENCOUNTER — Ambulatory Visit (HOSPITAL_COMMUNITY)
Admission: EM | Admit: 2018-01-05 | Discharge: 2018-01-05 | Disposition: A | Discharge status: Home / Self Care | Payer: BLUE CROSS/BLUE SHIELD | Attending: Family Medicine | Admitting: Family Medicine

## 2018-01-05 ENCOUNTER — Other Ambulatory Visit: Payer: Self-pay

## 2018-01-05 DIAGNOSIS — S61012A Laceration without foreign body of left thumb without damage to nail, initial encounter: Secondary | ICD-10-CM

## 2018-01-05 DIAGNOSIS — W260XXA Contact with knife, initial encounter: Secondary | ICD-10-CM

## 2018-01-05 NOTE — ED Triage Notes (Signed)
Laceration to the tip of there left thumb with a knife.

## 2018-01-05 NOTE — ED Provider Notes (Signed)
[facility]   [id number] 01/05/18 Arrival Time: 4580  ASSESSMENT & PLAN:  1. Laceration of left thumb without foreign body without damage to nail, initial encounter    Procedure: Verbal consent obtained. Patient provided with risks and alternatives to the procedure. Wound copiously irrigated with NS then cleansed with betadine. Digital block of L thumb performed with 3 mL of lidocaine without epinephrine. Wound carefully explored. No foreign body or nonviable tissue were noted. Using sterile technique 2 interrupted 6-0 Prolene sutures were placed to reapproximate the wound. Patient tolerated procedure well. No complications. Minimal bleeding. Patient advised to look for and return for any signs of infection such as redness, swelling, discharge, or worsening pain. Return for suture removal in 5-7 days.    Discharge Instructions     You may use over the counter ibuprofen or acetaminophen as needed.     Reviewed expectations re: course of current medical issues. Questions answered. Outlined signs and symptoms indicating need for more acute intervention. Patient verbalized understanding. After Visit Summary given.   SUBJECTIVE:  Julie Munoz is a 42 y.o. female who presents with a laceration of her L thumb. Today. Cut with knife at work. Moderate bleeding. Flushed under tap. Minimal discomfort. No ROM loss reported. No analgesics needed. No thumb sensation changes or weakness. Td UTD: Yes.  ROS: As per HPI.   OBJECTIVE:  Vitals:   01/05/18 1527  BP: 125/66  Pulse: (!) 59  Resp: 16  Temp: 97.7 F (36.5 C)  TempSrc: Oral  SpO2: 99%    General appearance: alert; no distress Skin: laceration of distal L thumb without nail involvement; size: approx 1 cm curved flap with active bleeding; brisk capillary refill; L thumb with FROM and normal sensation Psychological: alert and cooperative; normal mood and affect   Allergies  Allergen Reactions  . Iodine  Anaphylaxis  . Shellfish Allergy Anaphylaxis  . Other     NO BLOOD PRODUCTS - PT IS OF [employer] FAITH  . Tramadol Hives    Past Medical History:  Diagnosis Date  . Asthma    no inhaler -no problem as adult  . Environmental allergies    Social History   Socioeconomic History  . Marital status: Single    Spouse name: Not on file  . Number of children: Not on file  . Years of education: Not on file  . Highest education level: Not on file  Occupational History  . Not on file  Social Needs  . Financial resource strain: Not on file  . Food insecurity:    Worry: Not on file    Inability: Not on file  . Transportation needs:    Medical: Not on file    Non-medical: Not on file  Tobacco Use  . Smoking status: Former Smoker    Packs/day: 0.50    Years: 10.00    Pack years: 5.00    Types: Cigarettes    Last attempt to quit: 12/03/2012    Years since quitting: 5.0  . Smokeless tobacco: Never Used  . Tobacco comment: quit smoking in approx. Sept 2014  Substance and Sexual Activity  . Alcohol use: Yes    Comment: socially  . Drug use: No  . Sexual activity: Yes    Birth control/protection: None  Lifestyle  . Physical activity:    Days per week: Not on file    Minutes per session: Not on file  . Stress: Not on file  Relationships  . Social connections:  Talks on phone: Not on file    Gets together: Not on file    Attends religious service: Not on file    Active member of club or organization: Not on file    Attends meetings of clubs or organizations: Not on file    Relationship status: Not on file  Other Topics Concern  . Not on file  Social History Narrative  . Not on file         Vanessa Kick, MD 01/05/18 [phone]

## 2018-01-05 NOTE — Discharge Instructions (Signed)
You may use over the counter ibuprofen or acetaminophen as needed.  ° °

## 2018-01-10 ENCOUNTER — Other Ambulatory Visit: Payer: Self-pay

## 2018-01-10 ENCOUNTER — Encounter (HOSPITAL_COMMUNITY): Payer: Self-pay | Admitting: Emergency Medicine

## 2018-01-10 ENCOUNTER — Ambulatory Visit (HOSPITAL_COMMUNITY)
Admission: EM | Admit: 2018-01-10 | Discharge: 2018-01-10 | Disposition: A | Discharge status: Home / Self Care | Payer: BLUE CROSS/BLUE SHIELD | Attending: Family Medicine | Admitting: Family Medicine

## 2018-01-10 DIAGNOSIS — M79645 Pain in left finger(s): Secondary | ICD-10-CM

## 2018-01-10 DIAGNOSIS — Z5189 Encounter for other specified aftercare: Secondary | ICD-10-CM | POA: Diagnosis not present

## 2018-01-10 DIAGNOSIS — S61012D Laceration without foreign body of left thumb without damage to nail, subsequent encounter: Secondary | ICD-10-CM

## 2018-01-10 MED ORDER — HYDROCODONE-ACETAMINOPHEN 5-325 MG PO TABS: 1.0000 | ORAL_TABLET | Freq: Every evening | ORAL | 0 refills | Status: AC | PRN

## 2018-01-10 NOTE — Discharge Instructions (Addendum)
Be aware, pain medications may cause drowsiness. Please do not drive, operate heavy machinery or make important decisions while on this medication, it can cloud your judgement.  

## 2018-01-10 NOTE — ED Triage Notes (Signed)
Left thumb with sutures.  Patient concerned for edges being open, one suture has fallen out and is having pain

## 2018-01-10 NOTE — ED Provider Notes (Signed)
[name]   [id number] 01/10/18 Arrival Time: 2694  ASSESSMENT & PLAN:  1. Visit for wound check     Meds ordered this encounter  Medications  . HYDROcodone-acetaminophen (NORCO/VICODIN) 5-325 MG tablet    Sig: Take 1 tablet by mouth at bedtime as needed for moderate pain or severe pain.    Dispense:  8 tablet    Refill:  0   Steri-strips applied. Will do her best to keep thumb dry.   Discharge Instructions     Be aware, pain medications may cause drowsiness. Please do not drive, operate heavy machinery or make important decisions while on this medication, it can cloud your judgement.   Allergy to Tramadol.  She is welcome to follow up here if having any concerns over wound healing process. Discussed that it's going to take a little longer to heal since the tissue is going to have to fill in.  Reviewed expectations re: course of current medical issues. Questions answered. Outlined signs and symptoms indicating need for more acute intervention. Patient verbalized understanding. After Visit Summary given.   SUBJECTIVE:  Julie Munoz is a 42 y.o. female who presents for wound f/u. Sutured here last week. Reports one suture pulled through skin with bandage removal. "Kind of pulled the flap of skin up so the edges look open." No further bleeding. No erythema. Afebrile. No discharge. "Very painful; throbs. Keeps me up at night." OTC ibuprofen with minimal help. Has been working as a Training and development officer at [employer]. Keeping glove over thumb wound.  ROS: As per HPI.   OBJECTIVE:  Vitals:   01/10/18 1801  BP: 119/78  Pulse: 61  Resp: 18  Temp: 98.4 F (36.9 C)  TempSrc: Oral  SpO2: 100%     General appearance: alert; no distress L thumb with healing wound; one 6-0 Prolene suture removed without complication; no bleeding; she has had a little bleeding under the skin flap that has caused edges of wound to flare out; tender to touch; no signs of infection;  normal capillary refill Psychological: alert and cooperative; normal mood and affect  Results for orders placed or performed during the hospital encounter of 02/02/13  Rapid strep screen  Result Value Ref Range   Streptococcus, Group A Screen (Direct) NEGATIVE NEGATIVE  Culture, Group A Strep  Result Value Ref Range   Specimen Description THROAT    Special Requests NONE    Culture      No Beta Hemolytic Streptococci Isolated Performed at [facility]   Report Status 02/04/2013 FINAL   CBC with Differential  Result Value Ref Range   WBC 11.4 (H) 4.0 - 10.5 K/uL   RBC 4.44 3.87 - 5.11 MIL/uL   Hemoglobin 11.3 (L) 12.0 - 15.0 g/dL   HCT 35.6 (L) 36.0 - 46.0 %   MCV 80.2 78.0 - 100.0 fL   MCH 25.5 (L) 26.0 - 34.0 pg   MCHC 31.7 30.0 - 36.0 g/dL   RDW 20.7 (H) 11.5 - 15.5 %   Platelets 304 150 - 400 K/uL   Neutrophils Relative % 79 (H) 43 - 77 %   Neutro Abs 9.0 (H) 1.7 - 7.7 K/uL   Lymphocytes Relative 10 (L) 12 - 46 %   Lymphs Abs 1.1 0.7 - 4.0 K/uL   Monocytes Relative 7 3 - 12 %   Monocytes Absolute 0.9 0.1 - 1.0 K/uL   Eosinophils Relative 4 0 - 5 %   Eosinophils Absolute 0.4 0.0 - 0.7  K/uL   Basophils Relative 0 0 - 1 %   Basophils Absolute 0.0 0.0 - 0.1 K/uL  Comprehensive metabolic panel  Result Value Ref Range   Sodium 134 (L) 135 - 145 mEq/L   Potassium 4.0 3.5 - 5.1 mEq/L   Chloride 100 96 - 112 mEq/L   CO2 22 19 - 32 mEq/L   Glucose, Bld 144 (H) 70 - 99 mg/dL   BUN 4 (L) 6 - 23 mg/dL   Creatinine, Ser 0.64 0.50 - 1.10 mg/dL   Calcium 9.1 8.4 - 10.5 mg/dL   Total Protein 7.9 6.0 - 8.3 g/dL   Albumin 3.6 3.5 - 5.2 g/dL   AST 19 0 - 37 U/L   ALT 16 0 - 35 U/L   Alkaline Phosphatase 73 39 - 117 U/L   Total Bilirubin 0.3 0.3 - 1.2 mg/dL   GFR calc non Af Amer >90 >90 mL/min   GFR calc Af Amer >90 >90 mL/min  Lipase, blood  Result Value Ref Range   Lipase 15 11 - 59 U/L  Urinalysis, Routine w reflex microscopic  Result Value Ref Range   Color, Urine  YELLOW YELLOW   APPearance CLEAR CLEAR   Specific Gravity, Urine 1.014 1.005 - 1.030   pH 6.0 5.0 - 8.0   Glucose, UA NEGATIVE NEGATIVE mg/dL   Hgb urine dipstick NEGATIVE NEGATIVE   Bilirubin Urine NEGATIVE NEGATIVE   Ketones, ur NEGATIVE NEGATIVE mg/dL   Protein, ur NEGATIVE NEGATIVE mg/dL   Urobilinogen, UA 0.2 0.0 - 1.0 mg/dL   Nitrite NEGATIVE NEGATIVE   Leukocytes, UA NEGATIVE NEGATIVE  Pregnancy, urine  Result Value Ref Range   Preg Test, Ur NEGATIVE NEGATIVE  D-dimer, quantitative  Result Value Ref Range   D-Dimer, Quant 1.29 (H) 0.00 - 0.48 ug/mL-FEU  Troponin I  Result Value Ref Range   Troponin I <0.30 <0.30 ng/mL    Labs Reviewed - No data to display  No results found.  Allergies  Allergen Reactions  . Iodine Anaphylaxis  . Shellfish Allergy Anaphylaxis  . Other     NO BLOOD PRODUCTS - PT IS OF [employer] FA[employer]  . Tramadol Hives    Past Medical History:  Diagnosis Date  . Asthma    no inhaler -no problem as adult  . Environmental allergies    Social History   Socioeconomic History  . Marital status: Single    Spouse name: Not on file  . Number of children: Not on file  . Years of education: Not on file  . Highest education level: Not on file  Occupational History  . Not on file  Social Needs  . Financial resource strain: Not on file  . Food insecurity:    Worry: Not on file    Inability: Not on file  . Transportation needs:    Medical: Not on file    Non-medical: Not on file  Tobacco Use  . Smoking status: Former Smoker    Packs/day: 0.50    Years: 10.00    Pack years: 5.00    Types: Cigarettes    Last attempt to quit: 12/03/2012    Years since quitting: 5.1  . Smokeless tobacco: Never Used  . Tobacco comment: quit smoking in approx. Sept 2014  Substance and Sexual Activity  . Alcohol use: Yes    Comment: socially  . Drug use: No  . Sexual activity: Yes    Birth control/protection: None  Lifestyle  . Physical activity:  Days per week: Not on file    Minutes per session: Not on file  . Stress: Not on file  Relationships  . Social connections:    Talks on phone: Not on file    Gets together: Not on file    Attends religious service: Not on file    Active member of club or organization: Not on file    Attends meetings of clubs or organizations: Not on file    Relationship status: Not on file  Other Topics Concern  . Not on file  Social History Narrative  . Not on file         Vanessa Kick, MD 01/11/18 [phone]

## 2020-06-01 ENCOUNTER — Other Ambulatory Visit: Payer: Self-pay

## 2020-06-01 ENCOUNTER — Emergency Department (HOSPITAL_BASED_OUTPATIENT_CLINIC_OR_DEPARTMENT_OTHER)
Admission: EM | Admit: 2020-06-01 | Discharge: 2020-06-01 | Disposition: A | Discharge status: Home / Self Care | Payer: Self-pay | Attending: Emergency Medicine | Admitting: Emergency Medicine

## 2020-06-01 ENCOUNTER — Encounter (HOSPITAL_BASED_OUTPATIENT_CLINIC_OR_DEPARTMENT_OTHER): Payer: Self-pay

## 2020-06-01 DIAGNOSIS — Z87891 Personal history of nicotine dependence: Secondary | ICD-10-CM | POA: Insufficient documentation

## 2020-06-01 DIAGNOSIS — W57XXXA Bitten or stung by nonvenomous insect and other nonvenomous arthropods, initial encounter: Secondary | ICD-10-CM

## 2020-06-01 DIAGNOSIS — J45909 Unspecified asthma, uncomplicated: Secondary | ICD-10-CM | POA: Insufficient documentation

## 2020-06-01 DIAGNOSIS — S50862A Insect bite (nonvenomous) of left forearm, initial encounter: Secondary | ICD-10-CM | POA: Insufficient documentation

## 2020-06-01 MED ORDER — BACITRACIN ZINC 500 UNIT/GM EX OINT: 1.0000 "application " | TOPICAL_OINTMENT | Freq: Two times a day (BID) | CUTANEOUS | 0 refills | Status: DC

## 2020-06-01 MED ORDER — CEPHALEXIN 500 MG PO CAPS
500.0000 mg | ORAL_CAPSULE | Freq: Three times a day (TID) | ORAL | 0 refills | Status: AC
Start: 2020-06-01 — End: 2020-06-08

## 2020-06-01 NOTE — ED Triage Notes (Signed)
Insect bite to L forearm x 2 days.  Increased itching today.

## 2020-06-01 NOTE — Discharge Instructions (Addendum)
Apply warm compresses to the area for 20 minutes at a time 3 times daily. Apply bacitracin antibiotic ointment to area twice daily. Outline the area involved with permanent marker and monitor.  If area spreads beyond the outlined area, start oral antibiotics and complete the full course.  Recheck with your doctor for any concerns.

## 2020-06-01 NOTE — ED Provider Notes (Signed)
[facility] EMERGENCY DEPARTMENT Provider Note   CSN: [id number] Arrival date & time: 06/01/20  1734     History Chief Complaint  Patient presents with  . Insect Bite    Julie Munoz is a 45 y.o. female.  45 year old female presents with complaint of insect bites to the left forearm.  First noticed the area 2 days ago, now with increasing redness and pain at the site.  Did not see anything bite her.  No other complaints or concerns.        Past Medical History:  Diagnosis Date  . Asthma    no inhaler -no problem as adult  . Environmental allergies     Patient Active Problem List   Diagnosis Date Noted  . Pulmonary nodule 02/02/2013  . CAP (community acquired pneumonia) 02/02/2013  . Fibroid uterus 01/18/2013  . 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 Pelvic pain in female 08/08/2012    Past Surgical History:  Procedure Laterality Date  . ABDOMINAL HYSTERECTOMY    . BILATERAL SALPINGECTOMY Bilateral 01/18/2013   Procedure: BILATERAL SALPINGECTOMY;  Surgeon: Cheri Fowler, MD;  Location: [facility];  Service: Gynecology;  Laterality: Bilateral;  . CESAREAN SECTION    . CHOLECYSTECTOMY    . CORNEAL TRANSPLANT     surgeries x 3, bilateral left x 2, right x 1  . LAPAROSCOPIC ASSISTED VAGINAL HYSTERECTOMY N/A 01/18/2013   Procedure: LAPAROSCOPIC ASSISTED VAGINAL HYSTERECTOMY;  Surgeon: Cheri Fowler, MD;  Location: [facility];  Service: Gynecology;  Laterality: N/A;  . LAPAROSCOPY N/A 08/08/2012   Procedure: LAPAROSCOPY OPERATIVE;  Surgeon: Cheri Fowler, MD;  Location: [facility];  Service: Gynecology;  Laterality: N/A;     OB History    Gravida  1   Para  1   Term      Preterm      AB      Living  1     SAB      IAB      Ectopic      Multiple      Live Births              Family History  Problem Relation Age of Onset  . Asthma Mother     Social History   Tobacco Use  . Smoking status: Former Smoker    Packs/day: 0.50    Years:  10.00    Pack years: 5.00    Types: Cigarettes    Quit date: 12/03/2012    Years since quitting: 7.4  . Smokeless tobacco: Never Used  . Tobacco comment: quit smoking in approx. Sept 2014  Vaping Use  . Vaping Use: Never used  Substance Use Topics  . Alcohol use: Yes    Comment: socially  . Drug use: No    Home Medications Prior to Admission medications   Medication Sig Start Date End Date Taking? Authorizing Provider  albuterol (PROVENTIL HFA;VENTOLIN HFA) 108 (90 Base) MCG/ACT inhaler Inhale 1-2 puffs into the lungs every 6 (six) hours as needed for wheezing or shortness of breath.   Yes [provider]  albuterol (PROVENTIL) (2.5 MG/3ML) 0.083% nebulizer solution Take 2.5 mg by nebulization every 6 (six) hours as needed for wheezing or shortness of breath.   Yes [provider]  bacitracin ointment Apply 1 application topically 2 (two) times daily. 06/01/20  Yes Tacy Learn, PA-C  cephALEXin (KEFLEX) 500 MG capsule Take 1 capsule (500 mg total) by mouth 3 (three) times daily for 7 days. 06/01/20 06/08/20  Yes Tacy Learn, PA-C  HYDROcodone-acetaminophen (NORCO/VICODIN) 5-325 MG tablet Take 1 tablet by mouth at bedtime as needed for moderate pain or severe pain. 01/10/18  Yes Vanessa Kick, MD    Allergies    Iodine, Shellfish allergy, Other, and Tramadol  Review of Systems   Review of Systems  Constitutional: Negative for fever.  Musculoskeletal: Positive for myalgias.  Skin: Positive for rash. Negative for wound.  Allergic/Immunologic: Negative for immunocompromised state.  Neurological: Negative for weakness and numbness.    Physical Exam Updated Vital Signs BP 133/86 (BP Location: Right Arm)   Pulse 75   Temp 98.2 F (36.8 C) (Oral)   Resp 18   Ht 5\' 7"  (1.702 m)   Wt 136.1 kg   LMP 12/26/2012   SpO2 100%   BMI 46.99 kg/m   Physical Exam Vitals and nursing note reviewed.  Constitutional:      General: She is not in acute distress.     Appearance: She is well-developed. She is not diaphoretic.  HENT:     Head: Normocephalic and atraumatic.  Cardiovascular:     Pulses: Normal pulses.  Pulmonary:     Effort: Pulmonary effort is normal.  Musculoskeletal:        General: Tenderness present. Normal range of motion.     Comments: 2 erythematous nodules to the left forearm, no streaking.  Skin:    General: Skin is warm and dry.     Findings: Erythema present.  Neurological:     Mental Status: She is alert and oriented to person, place, and time.     Sensory: No sensory deficit.  Psychiatric:        Behavior: Behavior normal.     ED Results / Procedures / Treatments   Labs (all labs ordered are listed, but only abnormal results are displayed) Labs Reviewed - No data to display  EKG None  Radiology No results found.  Procedures Procedures   Medications Ordered in ED Medications - No data to display  ED Course  I have reviewed the triage vital signs and the nursing notes.  Pertinent labs & imaging results that were available during my care of the patient were reviewed by me and considered in my medical decision making (see chart for details).  Clinical Course as of 06/01/20 1804  Nancy Fetter Jun 01, 3437  283 45 year old female with possible insect bites to the left forearm with concern for secondary infection. Discussed with patient, can try topical antibiotics and warm compresses however if not improving, given prescription for Keflex to start oral antibiotics and recheck with PCP. [LM]    Clinical Course User Index [LM] Roque Lias   MDM Rules/Calculators/A&P                          Final Clinical Impression(s) / ED Diagnoses Final diagnoses:  Insect bite of left forearm, initial encounter    Rx / DC Orders ED Discharge Orders         Ordered    bacitracin ointment  2 times daily        06/01/20 1800    cephALEXin (KEFLEX) 500 MG capsule  3 times daily        06/01/20 1800            Roque Lias 06/01/20 1804    Gareth Morgan, MD 06/02/20 [phone]

## 2020-07-09 ENCOUNTER — Ambulatory Visit
Admission: EM | Admit: 2020-07-09 | Discharge: 2020-07-09 | Disposition: A | Discharge status: Home / Self Care | Payer: Medicaid Other | Attending: Family Medicine | Admitting: Family Medicine

## 2020-07-09 ENCOUNTER — Other Ambulatory Visit: Payer: Self-pay

## 2020-07-09 DIAGNOSIS — L500 Allergic urticaria: Secondary | ICD-10-CM

## 2020-07-09 MED ORDER — CETIRIZINE HCL 10 MG PO TABS: 10.0000 mg | ORAL_TABLET | Freq: Every day | ORAL | 2 refills | Status: DC

## 2020-07-09 MED ORDER — PREDNISONE 10 MG PO TABS: ORAL_TABLET | ORAL | 0 refills | Status: DC

## 2020-07-09 MED ORDER — TRIAMCINOLONE ACETONIDE 0.1 % EX CREA: 1.0000 "application " | TOPICAL_CREAM | Freq: Two times a day (BID) | CUTANEOUS | 0 refills | Status: AC

## 2020-07-09 NOTE — ED Triage Notes (Signed)
Pt c/o raised painful areas to back of neck since this am. States ? Bug bites.

## 2020-07-09 NOTE — ED Provider Notes (Signed)
[facility]    CSN: [id number] Arrival date & time: 07/09/20  1349      History   Chief Complaint Chief Complaint  Patient presents with  . Urticaria    HPI Julie Munoz is a 45 y.o. female.   Patient here today with painful, swollen and itchy hives on bilateral arms, posterior neck that have been coming up for the last few days.  She states last night while she was laying in her bed it felt like something was sticking her and then she woke up with significant swelling to posterior neck.  Has been taking ibuprofen with mild temporary relief of her pain, otherwise not trying anything over-the-counter.  Does have a known history of allergic rhinitis, not on anything for this.  Has had hives before in the past, unclear trigger.  No other new exposures or medications.     Past Medical History:  Diagnosis Date  . Asthma    no inhaler -no problem as adult  . Environmental allergies     Patient Active Problem List   Diagnosis Date Noted  . Pulmonary nodule 02/02/2013  . CAP (community acquired pneumonia) 02/02/2013  . Fibroid uterus 01/18/2013  . 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 Pelvic pain in female 08/08/2012    Past Surgical History:  Procedure Laterality Date  . ABDOMINAL HYSTERECTOMY    . BILATERAL SALPINGECTOMY Bilateral 01/18/2013   Procedure: BILATERAL SALPINGECTOMY;  Surgeon: Cheri Fowler, MD;  Location: [facility];  Service: Gynecology;  Laterality: Bilateral;  . CESAREAN SECTION    . CHOLECYSTECTOMY    . CORNEAL TRANSPLANT     surgeries x 3, bilateral left x 2, right x 1  . LAPAROSCOPIC ASSISTED VAGINAL HYSTERECTOMY N/A 01/18/2013   Procedure: LAPAROSCOPIC ASSISTED VAGINAL HYSTERECTOMY;  Surgeon: Cheri Fowler, MD;  Location: [facility];  Service: Gynecology;  Laterality: N/A;  . LAPAROSCOPY N/A 08/08/2012   Procedure: LAPAROSCOPY OPERATIVE;  Surgeon: Cheri Fowler, MD;  Location: [facility];  Service: Gynecology;  Laterality: N/A;    OB History     Gravida  1   Para  1   Term      Preterm      AB      Living  1     SAB      IAB      Ectopic      Multiple      Live Births               Home Medications    Prior to Admission medications   Medication Sig Start Date End Date Taking? Authorizing Provider  cetirizine (ZYRTEC ALLERGY) 10 MG tablet Take 1 tablet (10 mg total) by mouth daily. 07/09/20  Yes Volney American, PA-C  predniSONE (DELTASONE) 10 MG tablet Take 6 tabs day one, 5 tabs day two, 4 tabs day three, etc 07/09/20  Yes Volney American, PA-C  triamcinolone cream (KENALOG) 0.1 % Apply 1 application topically 2 (two) times daily. 07/09/20  Yes Volney American, PA-C  albuterol (PROVENTIL HFA;VENTOLIN HFA) 108 (90 Base) MCG/ACT inhaler Inhale 1-2 puffs into the lungs every 6 (six) hours as needed for wheezing or shortness of breath.    [provider]  albuterol (PROVENTIL) (2.5 MG/3ML) 0.083% nebulizer solution Take 2.5 mg by nebulization every 6 (six) hours as needed for wheezing or shortness of breath.    [provider]  bacitracin ointment Apply 1 application topically 2 (two) times daily. 06/01/20   Suella Broad  A, PA-C  HYDROcodone-acetaminophen (NORCO/VICODIN) 5-325 MG tablet Take 1 tablet by mouth at bedtime as needed for moderate pain or severe pain. 01/10/18   Vanessa Kick, MD    Family History Family History  Problem Relation Age of Onset  . Asthma Mother     Social History Social History   Tobacco Use  . Smoking status: Former Smoker    Packs/day: 0.50    Years: 10.00    Pack years: 5.00    Types: Cigarettes    Quit date: 12/03/2012    Years since quitting: 7.6  . Smokeless tobacco: Never Used  . Tobacco comment: quit smoking in approx. Sept 2014  Vaping Use  . Vaping Use: Never used  Substance Use Topics  . Alcohol use: Yes    Comment: socially  . Drug use: No     Allergies   Iodine, Shellfish allergy, Other, and Tramadol   Review of  Systems Review of Systems Per HPI  Physical Exam Triage Vital Signs ED Triage Vitals [07/09/20 1517]  Enc Vitals Group     BP 122/71     Pulse Rate 76     Resp 18     Temp 98.3 F (36.8 C)     Temp Source Oral     SpO2 98 %     Weight      Height      Head Circumference      Peak Flow      Pain Score 7     Pain Loc      Pain Edu?      Excl. in [place]?    No data found.  Updated Vital Signs BP 122/71 (BP Location: Left Arm)   Pulse 76   Temp 98.3 F (36.8 C) (Oral)   Resp 18   LMP 12/26/2012   SpO2 98%   Visual Acuity Right Eye Distance:   Left Eye Distance:   Bilateral Distance:    Right Eye Near:   Left Eye Near:    Bilateral Near:     Physical Exam Vitals and nursing note reviewed.  Constitutional:      Appearance: Normal appearance. She is not ill-appearing.  HENT:     Head: Atraumatic.     Nose: Nose normal.     Mouth/Throat:     Mouth: Mucous membranes are moist.     Pharynx: Oropharynx is clear.  Eyes:     Extraocular Movements: Extraocular movements intact.     Conjunctiva/sclera: Conjunctivae normal.  Cardiovascular:     Rate and Rhythm: Normal rate and regular rhythm.     Heart sounds: Normal heart sounds.  Pulmonary:     Effort: Pulmonary effort is normal.     Breath sounds: Normal breath sounds.  Abdominal:     General: Bowel sounds are normal. There is no distension.     Palpations: Abdomen is soft.     Tenderness: There is no abdominal tenderness. There is no guarding.  Musculoskeletal:        General: Normal range of motion.     Cervical back: Normal range of motion and neck supple.  Skin:    General: Skin is warm and dry.     Findings: Rash present.     Comments: Raised erythematous urticarial rash sporadically across bilateral arms and large clusters on posterior neck.  Neurological:     Mental Status: She is alert and oriented to person, place, and time.  Psychiatric:  Mood and Affect: Mood normal.        Thought  Content: Thought content normal.        Judgment: Judgment normal.      UC Treatments / Results  Labs (all labs ordered are listed, but only abnormal results are displayed) Labs Reviewed - No data to display  EKG   Radiology No results found.  Procedures Procedures (including critical care time)  Medications Ordered in UC Medications - No data to display  Initial Impression / Assessment and Plan / UC Course  I have reviewed the triage vital signs and the nursing notes.  Pertinent labs & imaging results that were available during my care of the patient were reviewed by me and considered in my medical decision making (see chart for details).     Consistent with allergic response but unknown trigger.  We will treat with prednisone taper, Kenalog cream, Zyrtec 1-2 times daily.  Follow-up with primary care, allergy specialist if recurring.  Avoid scented products or any other potential triggers. Note given. Final Clinical Impressions(s) / UC Diagnoses   Final diagnoses:  Allergic urticaria   Discharge Instructions   None    ED Prescriptions    Medication Sig Dispense Auth. Provider   predniSONE (DELTASONE) 10 MG tablet Take 6 tabs day one, 5 tabs day two, 4 tabs day three, etc 21 tablet Volney American, PA-C   triamcinolone cream (KENALOG) 0.1 % Apply 1 application topically 2 (two) times daily. 90 g Volney American, [place]   cetirizine (ZYRTEC ALLERGY) 10 MG tablet Take 1 tablet (10 mg total) by mouth daily. 30 tablet Volney American, [place]     PDMP not reviewed this encounter.   Volney American, [place] 07/09/20 1553

## 2020-11-15 ENCOUNTER — Emergency Department (HOSPITAL_COMMUNITY)
Admission: EM | Admit: 2020-11-15 | Discharge: 2020-11-16 | Disposition: A | Discharge status: Home / Self Care | Payer: Medicaid Other | Attending: Emergency Medicine | Admitting: Emergency Medicine

## 2020-11-15 ENCOUNTER — Emergency Department (HOSPITAL_COMMUNITY): Payer: Medicaid Other

## 2020-11-15 ENCOUNTER — Other Ambulatory Visit: Payer: Self-pay

## 2020-11-15 ENCOUNTER — Encounter (HOSPITAL_COMMUNITY): Payer: Self-pay | Admitting: Emergency Medicine

## 2020-11-15 DIAGNOSIS — R059 Cough, unspecified: Secondary | ICD-10-CM | POA: Insufficient documentation

## 2020-11-15 DIAGNOSIS — R0602 Shortness of breath: Secondary | ICD-10-CM | POA: Insufficient documentation

## 2020-11-15 DIAGNOSIS — J45909 Unspecified asthma, uncomplicated: Secondary | ICD-10-CM | POA: Insufficient documentation

## 2020-11-15 DIAGNOSIS — Z20822 Contact with and (suspected) exposure to covid-19: Secondary | ICD-10-CM | POA: Insufficient documentation

## 2020-11-15 DIAGNOSIS — Z87891 Personal history of nicotine dependence: Secondary | ICD-10-CM | POA: Insufficient documentation

## 2020-11-15 NOTE — ED Triage Notes (Signed)
Patient thought she was having GERD symptoms last night. This morning, patient states she was having a hard time catching her breath. Patient states childhood history of asthma. Used sons nebulizer and it didn't help

## 2020-11-15 NOTE — ED Provider Notes (Signed)
Emergency Medicine Provider Triage Evaluation Note  Julie GOGUEN , a 45 y.o. female  was evaluated in triage.  Pt complains of shortness of breath associated with wheeze that started earlier today.  Patient admits to reflux symptoms last night and woke up this morning due to shortness of breath.  No history of blood clots, recent surgeries or recent long immobilizations, and hormonal treatments.  No lower extremity edema.  Patient states she has some chest pain when lying down and with inspiration.  She has a remote history of asthma.  She tried her son's nebulizer treatment with no relief.  No sick contacts or known COVID exposures.  She has received both her COVID vaccines including her booster shot.  Review of Systems  Positive: SOB, CP, Wheeze Negative: fever  Physical Exam  BP (!) 135/100 (BP Location: Left Arm)   Pulse 89   Temp 98.2 F (36.8 C) (Oral)   Ht '5\' 7"'$  (1.702 m)   Wt 136.1 kg   LMP 12/26/2012   SpO2 92%   BMI 46.99 kg/m  Gen:   Awake, no distress   Resp:  Normal effort expiratory wheeze heard throughout MSK:   Moves extremities without difficulty  Other:    Medical Decision Making  Medically screening exam initiated at 11:33 PM.  Appropriate orders placed.  JHERI ROURKE was informed that the remainder of the evaluation will be completed by another provider, this initial triage assessment does not replace that evaluation, and the importance of remaining in the ED until their evaluation is complete.  [facility] EKG COVID test   Karie Kirks 11/15/20 2335    Merrily Pew, MD 11/16/20 [phone]

## 2020-11-16 LAB — CBC WITH DIFFERENTIAL/PLATELET
Abs Immature Granulocytes: 0.05 10*3/uL (ref 0.00–0.07)
Basophils Absolute: 0 10*3/uL (ref 0.0–0.1)
Basophils Relative: 0 %
Eosinophils Absolute: 0.2 10*3/uL (ref 0.0–0.5)
Eosinophils Relative: 3 %
HCT: 38 % (ref 36.0–46.0)
Hemoglobin: 12.5 g/dL (ref 12.0–15.0)
Immature Granulocytes: 1 %
Lymphocytes Relative: 12 %
Lymphs Abs: 1.1 10*3/uL (ref 0.7–4.0)
MCH: 30.5 pg (ref 26.0–34.0)
MCHC: 32.9 g/dL (ref 30.0–36.0)
MCV: 92.7 fL (ref 80.0–100.0)
Monocytes Absolute: 0.6 10*3/uL (ref 0.1–1.0)
Monocytes Relative: 7 %
Neutro Abs: 6.5 10*3/uL (ref 1.7–7.7)
Neutrophils Relative %: 77 %
Platelets: 245 10*3/uL (ref 150–400)
RBC: 4.1 MIL/uL (ref 3.87–5.11)
RDW: 13.7 % (ref 11.5–15.5)
WBC: 8.4 10*3/uL (ref 4.0–10.5)
nRBC: 0 % (ref 0.0–0.2)

## 2020-11-16 LAB — TROPONIN I (HIGH SENSITIVITY): Troponin I (High Sensitivity): 2 ng/L (ref <18)

## 2020-11-16 LAB — BASIC METABOLIC PANEL
Anion gap: 9 (ref 5–15)
BUN: 13 mg/dL (ref 6–20)
CO2: 24 mmol/L (ref 22–32)
Calcium: 8.8 mg/dL — ABNORMAL LOW (ref 8.9–10.3)
Chloride: 103 mmol/L (ref 98–111)
Creatinine, Ser: 0.71 mg/dL (ref 0.44–1.00)
GFR, Estimated: >60 mL/min (ref >60)
Glucose, Bld: 118 mg/dL — ABNORMAL HIGH (ref 70–99)
Potassium: 4.1 mmol/L (ref 3.5–5.1)
Sodium: 136 mmol/L (ref 135–145)

## 2020-11-16 LAB — RESP PANEL BY RT-PCR (FLU A&B, COVID) ARPGX2
Influenza A by PCR: NEGATIVE
Influenza B by PCR: NEGATIVE
SARS Coronavirus 2 by RT PCR: NEGATIVE

## 2020-11-16 MED ORDER — PREDNISONE 20 MG PO TABS
60.0000 mg | ORAL_TABLET | Freq: Once | ORAL | Status: AC
  Administered 2020-11-16: 60 mg via ORAL
  Filled 2020-11-16: qty 3

## 2020-11-16 MED ORDER — PREDNISONE 20 MG PO TABS: ORAL_TABLET | ORAL | 0 refills | Status: DC

## 2020-11-16 MED ORDER — IPRATROPIUM-ALBUTEROL 0.5-2.5 (3) MG/3ML IN SOLN
3.0000 mL | RESPIRATORY_TRACT | Status: AC
Start: 2020-11-16 — End: 2020-11-16
  Administered 2020-11-16 (×3): 3 mL via RESPIRATORY_TRACT
  Filled 2020-11-16: qty 6
  Filled 2020-11-16: qty 3

## 2020-11-16 NOTE — ED Notes (Signed)
Pt left without signing for discharge, expressed understanding of discharge paperwork, prescriptions and instructions.

## 2020-11-16 NOTE — ED Provider Notes (Signed)
[facility] DEPT Provider Note   CSN: [id number] Arrival date & time: 11/15/20  2316     History Chief Complaint  Patient presents with   Shortness of Breath    Julie Munoz is a 45 y.o. female.   Shortness of Breath Severity:  Moderate Onset quality:  Gradual Duration:  2 days Timing:  Constant Progression:  Worsening Chronicity:  Recurrent Context: not activity, not known allergens, not occupational exposure and not pollens   Relieved by:  None tried Worsened by:  Nothing Ineffective treatments:  None tried Associated symptoms: cough and wheezing   Associated symptoms: no abdominal pain, no fever, no rash, no syncope and no vomiting   Risk factors: no recent alcohol use       Past Medical History:  Diagnosis Date   Asthma    no inhaler -no problem as adult   Environmental allergies     Patient Active Problem List   Diagnosis Date Noted   Pulmonary nodule 02/02/2013   CAP (community acquired pneumonia) 02/02/2013   Fibroid uterus 01/18/2013   Fibroids, submucosal 08/08/2012   Pelvic pain in female 08/08/2012    Past Surgical History:  Procedure Laterality Date   ABDOMINAL HYSTERECTOMY     BILATERAL SALPINGECTOMY Bilateral 01/18/2013   Procedure: BILATERAL SALPINGECTOMY;  Surgeon: Cheri Fowler, MD;  Location: [facility];  Service: Gynecology;  Laterality: Bilateral;   CESAREAN SECTION     CHOLECYSTECTOMY     CORNEAL TRANSPLANT     surgeries x 3, bilateral left x 2, right x 1   LAPAROSCOPIC ASSISTED VAGINAL HYSTERECTOMY N/A 01/18/2013   Procedure: LAPAROSCOPIC ASSISTED VAGINAL HYSTERECTOMY;  Surgeon: Cheri Fowler, MD;  Location: [facility];  Service: Gynecology;  Laterality: N/A;   LAPAROSCOPY N/A 08/08/2012   Procedure: LAPAROSCOPY OPERATIVE;  Surgeon: Cheri Fowler, MD;  Location: [facility];  Service: Gynecology;  Laterality: N/A;     OB History     Gravida  1   Para  1   Term      Preterm      AB      Living   1      SAB      IAB      Ectopic      Multiple      Live Births              Family History  Problem Relation Age of Onset   Asthma Mother     Social History   Tobacco Use   Smoking status: Former    Packs/day: 0.50    Years: 10.00    Pack years: 5.00    Types: Cigarettes    Quit date: 12/03/2012    Years since quitting: 7.9   Smokeless tobacco: Never   Tobacco comments:    quit smoking in approx. Sept 2014  Vaping Use   Vaping Use: Never used  Substance Use Topics   Alcohol use: Yes    Comment: socially   Drug use: No    Home Medications Prior to Admission medications   Medication Sig Start Date End Date Taking? Authorizing Provider  albuterol (PROVENTIL HFA;VENTOLIN HFA) 108 (90 Base) MCG/ACT inhaler Inhale 1-2 puffs into the lungs every 6 (six) hours as needed for wheezing or shortness of breath.   Yes [provider]  albuterol (PROVENTIL) (2.5 MG/3ML) 0.083% nebulizer solution Take 2.5 mg by nebulization every 6 (six) hours as needed for wheezing or shortness of breath.   Yes [provider]  HYDROcodone-acetaminophen (NORCO/VICODIN) 5-325 MG tablet Take 1 tablet by mouth at bedtime as needed for moderate pain or severe pain. 01/10/18  Yes Hagler, Aaron Edelman, MD  predniSONE (DELTASONE) 20 MG tablet 3 tabs po day one, then 2 tabs daily x 4 days 11/16/20  Yes Adorian Gwynne, Corene Cornea, MD  triamcinolone cream (KENALOG) 0.1 % Apply 1 application topically 2 (two) times daily. 07/09/20   Volney American, PA-C    Allergies    Iodine, Shellfish allergy, Other, and Tramadol  Review of Systems   Review of Systems  Constitutional:  Negative for fever.  Respiratory:  Positive for cough, shortness of breath and wheezing.   Cardiovascular:  Negative for syncope.  Gastrointestinal:  Negative for abdominal pain and vomiting.  Skin:  Negative for rash.  All other systems reviewed and are negative.  Physical Exam Updated Vital Signs BP (!) 164/93   Pulse  80   Temp 98.2 F (36.8 C) (Oral)   Resp 18   Ht '5\' 7"'$  (1.702 m)   Wt 136.1 kg   LMP 12/26/2012   SpO2 100%   BMI 46.99 kg/m   Physical Exam Vitals and nursing note reviewed.  Constitutional:      Appearance: She is well-developed.  HENT:     Head: Normocephalic and atraumatic.  Cardiovascular:     Rate and Rhythm: Normal rate and regular rhythm.  Pulmonary:     Effort: Tachypnea present. No respiratory distress.     Breath sounds: No stridor. Decreased breath sounds and wheezing present.  Chest:     Chest wall: No mass, deformity or tenderness.  Abdominal:     General: There is no distension.  Musculoskeletal:        General: Normal range of motion.     Cervical back: Normal range of motion.     Right lower leg: No tenderness. No edema.     Left lower leg: No tenderness. No edema.  Skin:    General: Skin is warm and dry.  Neurological:     General: No focal deficit present.     Mental Status: She is alert.    ED Results / Procedures / Treatments   Labs (all labs ordered are listed, but only abnormal results are displayed) Labs Reviewed  BASIC METABOLIC PANEL - Abnormal; Notable for the following components:      Result Value   Glucose, Bld 118 (*)    Calcium 8.8 (*)    All other components within normal limits  RESP PANEL BY RT-PCR (FLU A&B, COVID) ARPGX2  CBC WITH DIFFERENTIAL/PLATELET  TROPONIN I (HIGH SENSITIVITY)    EKG EKG Interpretation  Date/Time:  Saturday November 15 2020 23:56:57 EDT Ventricular Rate:  84 PR Interval:  197 QRS Duration: 79 QT Interval:  370 QTC Calculation: 438 R Axis:   31 Text Interpretation: Sinus rhythm Low voltage, precordial leads Confirmed by Merrily Pew [id number]) on 11/16/2020 1:41:20 AM  Radiology DG Chest Portable 1 View  Result Date: 11/16/2020 CLINICAL DATA:  Shortness of breath EXAM: PORTABLE CHEST 1 VIEW COMPARISON:  01/09/2016 FINDINGS: The heart size and mediastinal contours are within normal limits. Both  lungs are clear. The visualized skeletal structures are unremarkable. IMPRESSION: No active disease. Electronically Signed   By: Donavan Foil M.D.   On: 11/16/2020 00:01    Procedures Procedures   Medications Ordered in ED Medications  ipratropium-albuterol (DUONEB) 0.5-2.5 (3) MG/3ML nebulizer solution 3 mL (3 mLs Nebulization Given 11/16/20 0114)  predniSONE (DELTASONE) tablet 60 mg (  60 mg Oral Given 11/16/20 0044)    ED Course  I have reviewed the triage vital signs and the nursing notes.  Pertinent labs & imaging results that were available during my care of the patient were reviewed by me and considered in my medical decision making (see chart for details).    MDM Rules/Calculators/A&P                         Likely asthma exacerbation. No distress. Will treat until feels better.  Patient feels much better after duonebs. Workup ok. Likely asthma related. Dc on prednisone. No indication for antibiotics. Already has inhaler.   Final Clinical Impression(s) / ED Diagnoses Final diagnoses:  SOB (shortness of breath)    Rx / DC Orders ED Discharge Orders          Ordered    predniSONE (DELTASONE) 20 MG tablet        11/16/20 0213             Auria Mckinlay, Corene Cornea, MD 11/16/20 [phone]

## 2021-07-28 ENCOUNTER — Other Ambulatory Visit: Payer: Self-pay

## 2021-07-28 ENCOUNTER — Telehealth (HOSPITAL_BASED_OUTPATIENT_CLINIC_OR_DEPARTMENT_OTHER): Payer: Self-pay | Admitting: Emergency Medicine

## 2021-07-28 ENCOUNTER — Encounter (HOSPITAL_BASED_OUTPATIENT_CLINIC_OR_DEPARTMENT_OTHER): Payer: Self-pay

## 2021-07-28 ENCOUNTER — Encounter (HOSPITAL_COMMUNITY): Payer: Self-pay | Admitting: Pharmacy Technician

## 2021-07-28 ENCOUNTER — Emergency Department (HOSPITAL_COMMUNITY)
Admission: EM | Admit: 2021-07-28 | Discharge: 2021-07-28 | Disposition: A | Discharge status: Home / Self Care | Payer: No Typology Code available for payment source | Attending: Emergency Medicine | Admitting: Emergency Medicine

## 2021-07-28 ENCOUNTER — Emergency Department (HOSPITAL_BASED_OUTPATIENT_CLINIC_OR_DEPARTMENT_OTHER)
Admission: EM | Admit: 2021-07-28 | Discharge: 2021-07-28 | Disposition: A | Discharge status: Home / Self Care | Payer: No Typology Code available for payment source | Source: Home / Self Care | Attending: Emergency Medicine | Admitting: Emergency Medicine

## 2021-07-28 DIAGNOSIS — Z7952 Long term (current) use of systemic steroids: Secondary | ICD-10-CM | POA: Insufficient documentation

## 2021-07-28 DIAGNOSIS — W260XXA Contact with knife, initial encounter: Secondary | ICD-10-CM | POA: Insufficient documentation

## 2021-07-28 DIAGNOSIS — S61311D Laceration without foreign body of left index finger with damage to nail, subsequent encounter: Secondary | ICD-10-CM

## 2021-07-28 DIAGNOSIS — J45909 Unspecified asthma, uncomplicated: Secondary | ICD-10-CM | POA: Insufficient documentation

## 2021-07-28 DIAGNOSIS — Z23 Encounter for immunization: Secondary | ICD-10-CM | POA: Diagnosis not present

## 2021-07-28 DIAGNOSIS — S61311A Laceration without foreign body of left index finger with damage to nail, initial encounter: Secondary | ICD-10-CM | POA: Insufficient documentation

## 2021-07-28 DIAGNOSIS — W268XXA Contact with other sharp object(s), not elsewhere classified, initial encounter: Secondary | ICD-10-CM | POA: Diagnosis not present

## 2021-07-28 DIAGNOSIS — S6992XA Unspecified injury of left wrist, hand and finger(s), initial encounter: Secondary | ICD-10-CM | POA: Diagnosis present

## 2021-07-28 MED ORDER — TETANUS-DIPHTH-ACELL PERTUSSIS 5-2.5-18.5 LF-MCG/0.5 IM SUSY
0.5000 mL | PREFILLED_SYRINGE | Freq: Once | INTRAMUSCULAR | Status: AC
  Administered 2021-07-28: 0.5 mL via INTRAMUSCULAR
  Filled 2021-07-28: qty 0.5

## 2021-07-28 MED ORDER — LIDOCAINE HCL 2 % IJ SOLN
10.0000 mL | Freq: Once | INTRAMUSCULAR | Status: DC
  Filled 2021-07-28: qty 20

## 2021-07-28 MED ORDER — LIDOCAINE HCL (PF) 1 % IJ SOLN
5.0000 mL | Freq: Once | INTRAMUSCULAR | Status: AC
  Administered 2021-07-28: 5 mL
  Filled 2021-07-28: qty 30

## 2021-07-28 NOTE — ED Notes (Signed)
Patient Alert and oriented to baseline. Stable and ambulatory to baseline. Patient verbalized understanding of the discharge instructions.  Patient belongings were taken by the patient.   

## 2021-07-28 NOTE — Discharge Instructions (Signed)
You were seen in the emergency department for a laceration.  I spoke with Dr Tempie Donning with [facility]. He wants you to call his office tomorrow morning and schedule an appointment to take care of your nail injury. I've attached his contact information above.  You can take over the counter medications like ibuprofen or tylenol as needed for pain. Keep the area dry and clean, and try to avoid touching it as much as possible.

## 2021-07-28 NOTE — ED Provider Notes (Signed)
[place] DEPT Provider Note   CSN: [id number] Arrival date & time: 07/28/21  1659     History  Chief Complaint  Patient presents with   Laceration    Julie Munoz is a 46 y.o. female.  46 year old right-hand-dominant female presents with laceration left index finger which occurred this morning while cutting lettuce.  Laceration does involve the nail, was seen at [facility] this morning who consulted hand Ortho and recommend follow-up in office tomorrow.  Patient does not wish to follow-up with orthopedics, request to have the side of her wound closed today.  Last tetanus unknown.      Home Medications Prior to Admission medications   Medication Sig Start Date End Date Taking? Authorizing Provider  albuterol (PROVENTIL HFA;VENTOLIN HFA) 108 (90 Base) MCG/ACT inhaler Inhale 1-2 puffs into the lungs every 6 (six) hours as needed for wheezing or shortness of breath.    [provider]  albuterol (PROVENTIL) (2.5 MG/3ML) 0.083% nebulizer solution Take 2.5 mg by nebulization every 6 (six) hours as needed for wheezing or shortness of breath.    [provider]  HYDROcodone-acetaminophen (NORCO/VICODIN) 5-325 MG tablet Take 1 tablet by mouth at bedtime as needed for moderate pain or severe pain. 01/10/18   Vanessa Kick, MD  predniSONE (DELTASONE) 20 MG tablet 3 tabs po day one, then 2 tabs daily x 4 days 11/16/20   Mesner, Corene Cornea, MD  triamcinolone cream (KENALOG) 0.1 % Apply 1 application topically 2 (two) times daily. 07/09/20   Volney American, PA-C      Allergies    Iodine, Shellfish allergy, Other, and Tramadol    Review of Systems   Review of Systems Negative except as per HPI Physical Exam Updated Vital Signs BP (!) 154/94 (BP Location: Right Arm)   Pulse 74   Temp 98.1 F (36.7 C) (Oral)   Resp 20   LMP 12/26/2012   SpO2 100%  Physical Exam Vitals and nursing note reviewed.  Constitutional:       General: She is not in acute distress.    Appearance: She is well-developed. She is not diaphoretic.  HENT:     Head: Normocephalic and atraumatic.  Pulmonary:     Effort: Pulmonary effort is normal.  Musculoskeletal:        General: Tenderness and signs of injury present. Normal range of motion.     Comments: Laceration to radial aspect distal phalanx left index finger involving the nail. No active bleeding. Sensation intact, ROM normal, no weakness, brisk cap refill present  Skin:    General: Skin is warm and dry.  Neurological:     Mental Status: She is alert and oriented to person, place, and time.     Sensory: No sensory deficit.     Motor: No weakness.  Psychiatric:        Behavior: Behavior normal.    ED Results / Procedures / Treatments   Labs (all labs ordered are listed, but only abnormal results are displayed) Labs Reviewed - No data to display  EKG None  Radiology No results found.  Procedures .Marland KitchenLaceration Repair  Date/Time: 07/28/2021 7:05 PM Performed by: Tacy Learn, PA-C Authorized by: Tacy Learn, PA-C   Consent:    Consent obtained:  Verbal   Consent given by:  Patient   Risks discussed:  Infection, need for additional repair, pain, poor cosmetic result and poor wound healing   Alternatives discussed:  No treatment and delayed treatment  Universal protocol:    Procedure explained and questions answered to patient or proxy's satisfaction: yes     Relevant documents present and verified: yes     Test results available: yes     Imaging studies available: yes     Required blood products, implants, devices, and special equipment available: yes     Site/side marked: yes     Immediately prior to procedure, a time out was called: yes     Patient identity confirmed:  Verbally with patient Anesthesia:    Anesthesia method:  Local infiltration   Local anesthetic:  Lidocaine 1% w/o epi Laceration details:    Location:  Finger   Finger location:  L  index finger   Length (cm):  7   Depth (mm):  3 Pre-procedure details:    Preparation:  Patient was prepped and draped in usual sterile fashion Exploration:    Hemostasis achieved with:  Direct pressure   Wound exploration: wound explored through full range of motion and entire depth of wound visualized     Wound extent: no foreign bodies/material noted, no muscle damage noted, no tendon damage noted, no underlying fracture noted and no vascular damage noted     Contaminated: no   Treatment:    Area cleansed with:  Saline   Amount of cleaning:  Extensive   Irrigation solution:  Sterile saline Skin repair:    Repair method:  Sutures   Suture size:  4-0   Suture material:  Nylon   Suture technique:  Simple interrupted   Number of sutures:  2 Approximation:    Approximation:  Close Repair type:    Repair type:  Simple Post-procedure details:    Dressing:  Bulky dressing and splint for protection   Procedure completion:  Tolerated well, no immediate complications    Medications Ordered in ED Medications  lidocaine (PF) (XYLOCAINE) 1 % injection 5 mL (5 mLs Infiltration Given by Other 07/28/21 1831)  Tdap (BOOSTRIX) injection 0.5 mL (0.5 mLs Intramuscular Given 07/28/21 1831)    ED Course/ Medical Decision Making/ A&P                           Medical Decision Making Risk Prescription drug management.   46 year old female returns emergency room with laceration to her left distal index finger as above.  Patient was first seen at another ER this morning who consulted hand surgery who recommended follow-up in the office tomorrow for treatment of the laceration involving the nailbed.  Patient does not wish to follow-up with hand surgery, would like to leave her nail in place and have closure of the laceration extend the radial aspect of the nail.  Wound was irrigated, anesthetized, closed with 2 simple sutures.  Patient is encouraged to follow-up with Ortho as discussed for evaluation  of her nailbed injury and to discuss treatment options.  She is provided with follow-up information.        Final Clinical Impression(s) / ED Diagnoses Final diagnoses:  Laceration of left index finger without foreign body with damage to nail, subsequent encounter    Rx / DC Orders ED Discharge Orders     None         Roque Lias 07/28/21 1907    Tegeler, Gwenyth Allegra, MD 07/28/21 2132

## 2021-07-28 NOTE — Discharge Instructions (Signed)
Follow up with hand surgeon. Suture removal in 7 days. Keep wound clean and dry.

## 2021-07-28 NOTE — ED Triage Notes (Signed)
Pt with lac to L index finger. Pt unsure if UTD on tdap.

## 2021-07-28 NOTE — Telephone Encounter (Signed)
I originally thought that I had spoken with Dr. Tempie Donning with Ortho care as he was listed as on-call for our facility.  Received contact from him that he did not hear about this patient, and believes that we actually spoke with Dr. Greta Doom who is on-call for [facility].  I attempted to call the patient back, but got her answering machine.  I left a message explaining the mistake, and given correct contact info for Dr. Greta Doom with [facility] [phone]).

## 2021-07-28 NOTE — ED Provider Notes (Signed)
[place] EMERGENCY DEPARTMENT Provider Note   CSN: [id number] Arrival date & time: 07/28/21  5361     History  Chief Complaint  Patient presents with   Laceration    Julie Munoz is a 46 y.o. female who presents to the emergency department for finger laceration. Pt reports cutting her left pointer finger with a knife this morning before work. She states right after the cut she placed pressure over the wound and wrapped it before coming to the ER. Tetanus up to date.   Laceration     Home Medications Prior to Admission medications   Medication Sig Start Date End Date Taking? Authorizing Provider  albuterol (PROVENTIL HFA;VENTOLIN HFA) 108 (90 Base) MCG/ACT inhaler Inhale 1-2 puffs into the lungs every 6 (six) hours as needed for wheezing or shortness of breath.    [provider]  albuterol (PROVENTIL) (2.5 MG/3ML) 0.083% nebulizer solution Take 2.5 mg by nebulization every 6 (six) hours as needed for wheezing or shortness of breath.    [provider]  HYDROcodone-acetaminophen (NORCO/VICODIN) 5-325 MG tablet Take 1 tablet by mouth at bedtime as needed for moderate pain or severe pain. 01/10/18   Vanessa Kick, MD  predniSONE (DELTASONE) 20 MG tablet 3 tabs po day one, then 2 tabs daily x 4 days 11/16/20   Mesner, Corene Cornea, MD  triamcinolone cream (KENALOG) 0.1 % Apply 1 application topically 2 (two) times daily. 07/09/20   Volney American, PA-C      Allergies    Iodine, Shellfish allergy, Other, and Tramadol    Review of Systems   Review of Systems  Physical Exam Updated Vital Signs Ht '5\' 7"'$  (1.702 m)   Wt 113.4 kg   LMP 12/26/2012   BMI 39.16 kg/m  Physical Exam Vitals and nursing note reviewed.  Constitutional:      Appearance: Normal appearance.  HENT:     Head: Normocephalic and atraumatic.  Eyes:     Conjunctiva/sclera: Conjunctivae normal.  Pulmonary:     Effort: Pulmonary effort is normal. No respiratory distress.   Musculoskeletal:     Left hand: Laceration present.     Comments: Laceration noted to the lateral left 2nd digit with nail damage. Crack in the nail, unable to fully assess nailbed damage. Small amount of bleeding. Normal sensation.   Skin:    General: Skin is warm and dry.  Neurological:     Mental Status: She is alert.  Psychiatric:        Mood and Affect: Mood normal.        Behavior: Behavior normal.        ED Results / Procedures / Treatments   Labs (all labs ordered are listed, but only abnormal results are displayed) Labs Reviewed - No data to display  EKG None  Radiology No results found.  Procedures Procedures    Medications Ordered in ED Medications  lidocaine (XYLOCAINE) 2 % (with pres) injection 200 mg (200 mg Infiltration Not Given 07/28/21 4431)    ED Course/ Medical Decision Making/ A&P                           Medical Decision Making  This patient is a 46 y.o. female who presents to the ED for concern of finger laceration occurring this morning. Tetanus is up to date.   Past Medical History / Co-morbidities / Social History: Asthma  Physical Exam: Physical exam performed. The pertinent findings include: Laceration  noted to the left 2nd digit with nail involvement. Normal sensation.   Consultations obtained:  I consulted orthopedic surgeon Dr. Tempie Donning. After discussing the case and injury to the nail, he recommended bandaging the wound and he will see the patient in clinic tomorrow for repair.   Disposition: After consideration of the diagnostic results and the patients response to treatment, I feel that patient is not requiring admission or inpatient treatment for her symptoms. Wound bandaged and follow up information with Dr Tempie Donning provided. Discussed reasons to return to the emergency department, and the patient is agreeable to the plan.   I discussed this case with my attending physician Dr. Roslynn Amble who cosigned this note including  patient's presenting symptoms, physical exam, and planned diagnostics and interventions. Attending physician stated agreement with plan or made changes to plan which were implemented.   Final Clinical Impression(s) / ED Diagnoses Final diagnoses:  Laceration of left index finger without foreign body with damage to nail, initial encounter    Rx / DC Orders ED Discharge Orders     None      Portions of this report may have been transcribed using voice recognition software. Every effort was made to ensure accuracy; however, inadvertent computerized transcription errors may be present.    Estill Cotta 07/28/21 1003    Lucrezia Starch, MD 07/28/21 1314

## 2021-07-28 NOTE — ED Triage Notes (Signed)
Pt states she cut the tip of her left index finger this morning.

## 2021-08-09 ENCOUNTER — Ambulatory Visit (HOSPITAL_COMMUNITY)
Admission: EM | Admit: 2021-08-09 | Discharge: 2021-08-09 | Disposition: A | Discharge status: Home / Self Care | Payer: No Typology Code available for payment source

## 2021-08-09 ENCOUNTER — Encounter (HOSPITAL_COMMUNITY): Payer: Self-pay | Admitting: Emergency Medicine

## 2021-08-09 ENCOUNTER — Other Ambulatory Visit: Payer: Self-pay

## 2021-08-09 DIAGNOSIS — S61311D Laceration without foreign body of left index finger with damage to nail, subsequent encounter: Secondary | ICD-10-CM

## 2021-08-09 NOTE — ED Triage Notes (Signed)
Seen 07/28/2021.  Patient had 2 sutures placed in left index finger.  Patient here today for suture removal

## 2023-03-04 ENCOUNTER — Other Ambulatory Visit: Payer: Self-pay

## 2023-03-04 ENCOUNTER — Emergency Department (HOSPITAL_COMMUNITY): Payer: No Typology Code available for payment source

## 2023-03-04 ENCOUNTER — Emergency Department (HOSPITAL_COMMUNITY)
Admission: EM | Admit: 2023-03-04 | Discharge: 2023-03-04 | Disposition: A | Discharge status: Home / Self Care | Payer: No Typology Code available for payment source | Attending: Emergency Medicine | Admitting: Emergency Medicine

## 2023-03-04 DIAGNOSIS — J4521 Mild intermittent asthma with (acute) exacerbation: Secondary | ICD-10-CM | POA: Diagnosis not present

## 2023-03-04 DIAGNOSIS — Z7951 Long term (current) use of inhaled steroids: Secondary | ICD-10-CM | POA: Insufficient documentation

## 2023-03-04 DIAGNOSIS — R0789 Other chest pain: Secondary | ICD-10-CM

## 2023-03-04 DIAGNOSIS — R0602 Shortness of breath: Secondary | ICD-10-CM | POA: Diagnosis present

## 2023-03-04 LAB — CBC
HCT: 41.9 % (ref 36.0–46.0)
Hemoglobin: 13.4 g/dL (ref 12.0–15.0)
MCH: 29.6 pg (ref 26.0–34.0)
MCHC: 32 g/dL (ref 30.0–36.0)
MCV: 92.7 fL (ref 80.0–100.0)
Platelets: 256 10*3/uL (ref 150–400)
RBC: 4.52 MIL/uL (ref 3.87–5.11)
RDW: 14.9 % (ref 11.5–15.5)
WBC: 6.1 10*3/uL (ref 4.0–10.5)
nRBC: 0 % (ref 0.0–0.2)

## 2023-03-04 LAB — BASIC METABOLIC PANEL
Anion gap: 7 (ref 5–15)
BUN: 9 mg/dL (ref 6–20)
CO2: 24 mmol/L (ref 22–32)
Calcium: 8.3 mg/dL — ABNORMAL LOW (ref 8.9–10.3)
Chloride: 101 mmol/L (ref 98–111)
Creatinine, Ser: 0.65 mg/dL (ref 0.44–1.00)
GFR, Estimated: >60 mL/min (ref >60)
Glucose, Bld: 90 mg/dL (ref 70–99)
Potassium: 3.6 mmol/L (ref 3.5–5.1)
Sodium: 132 mmol/L — ABNORMAL LOW (ref 135–145)

## 2023-03-04 LAB — TROPONIN I (HIGH SENSITIVITY)
Troponin I (High Sensitivity): 3 ng/L (ref <18)
Troponin I (High Sensitivity): 3 ng/L (ref <18)

## 2023-03-04 MED ORDER — ALBUTEROL SULFATE (2.5 MG/3ML) 0.083% IN NEBU
5.0000 mg | INHALATION_SOLUTION | Freq: Once | RESPIRATORY_TRACT | Status: AC
  Administered 2023-03-04: 5 mg via RESPIRATORY_TRACT
  Filled 2023-03-04: qty 6

## 2023-03-04 MED ORDER — CYCLOBENZAPRINE HCL 5 MG PO TABS: 5.0000 mg | ORAL_TABLET | Freq: Three times a day (TID) | ORAL | 0 refills | Status: AC | PRN

## 2023-03-04 MED ORDER — DIPHENHYDRAMINE HCL 50 MG/ML IJ SOLN
25.0000 mg | Freq: Once | INTRAMUSCULAR | Status: AC
  Administered 2023-03-04: 25 mg via INTRAVENOUS
  Filled 2023-03-04: qty 1

## 2023-03-04 MED ORDER — METOCLOPRAMIDE HCL 5 MG/ML IJ SOLN
10.0000 mg | Freq: Once | INTRAMUSCULAR | Status: AC
Start: 2023-03-04 — End: 2023-03-04
  Administered 2023-03-04: 10 mg via INTRAVENOUS
  Filled 2023-03-04: qty 2

## 2023-03-04 MED ORDER — PREDNISONE 20 MG PO TABS: ORAL_TABLET | ORAL | 0 refills | Status: AC

## 2023-03-04 MED ORDER — METHYLPREDNISOLONE SODIUM SUCC 125 MG IJ SOLR
125.0000 mg | Freq: Once | INTRAMUSCULAR | Status: AC
  Administered 2023-03-04: 125 mg via INTRAVENOUS
  Filled 2023-03-04: qty 2

## 2023-03-04 NOTE — ED Provider Notes (Signed)
[place] EMERGENCY DEPARTMENT AT [facility] Provider Note   CSN: [id number] Arrival date & time: 03/04/23  1610     History  Chief Complaint  Patient presents with   Chest Pain   Arm Pain    ADERYN CROYLE is a 47 y.o. female here presenting with shortness of breath and chest pain.  Patient states that she has intermittent chest pain and shortness of breath for the last several days.  Patient states that she uses albuterol and initially helped it but has not been helping.  Patient states that she has a history of bronchitis previously but did not have similar symptoms.  Patient denies any history of CAD or stents.  Patient denies any recent travel or history of blood clots.  The history is provided by the patient.       Home Medications Prior to Admission medications   Medication Sig Start Date End Date Taking? Authorizing Provider  albuterol (PROVENTIL HFA;VENTOLIN HFA) 108 (90 Base) MCG/ACT inhaler Inhale 1-2 puffs into the lungs every 6 (six) hours as needed for wheezing or shortness of breath.    [provider]  albuterol (PROVENTIL) (2.5 MG/3ML) 0.083% nebulizer solution Take 2.5 mg by nebulization every 6 (six) hours as needed for wheezing or shortness of breath.    [provider]  HYDROcodone-acetaminophen (NORCO/VICODIN) 5-325 MG tablet Take 1 tablet by mouth at bedtime as needed for moderate pain or severe pain. 01/10/18   Mardella Layman, MD  predniSONE (DELTASONE) 20 MG tablet 3 tabs po day one, then 2 tabs daily x 4 days 11/16/20   Mesner, Barbara Cower, MD  triamcinolone cream (KENALOG) 0.1 % Apply 1 application topically 2 (two) times daily. 07/09/20   Particia Nearing, PA-C      Allergies    Iodine, Shellfish allergy, Other, and Tramadol    Review of Systems   Review of Systems  Cardiovascular:  Positive for chest pain.  All other systems reviewed and are negative.   Physical Exam Updated Vital Signs BP (!) 154/89   Pulse 68    Temp 98 F (36.7 C) (Oral)   Resp 14   Ht 5' 7.5" (1.715 m)   Wt 120.2 kg   LMP 12/26/2012   SpO2 100%   BMI 40.89 kg/m  Physical Exam Vitals and nursing note reviewed.  Constitutional:      Appearance: She is well-developed.  HENT:     Head: Normocephalic.  Eyes:     Extraocular Movements: Extraocular movements intact.     Pupils: Pupils are equal, round, and reactive to light.  Cardiovascular:     Heart sounds: Normal heart sounds.  Pulmonary:     Comments: Diminished bilaterally.  No obvious wheezing. Abdominal:     General: Bowel sounds are normal.     Palpations: Abdomen is soft.  Musculoskeletal:        General: Normal range of motion.     Cervical back: Normal range of motion and neck supple.  Skin:    General: Skin is warm.  Neurological:     General: No focal deficit present.     Mental Status: She is alert and oriented to person, place, and time.  Psychiatric:        Mood and Affect: Mood normal.        Behavior: Behavior normal.     ED Results / Procedures / Treatments   Labs (all labs ordered are listed, but only abnormal results are displayed) Labs Reviewed  BASIC METABOLIC PANEL - Abnormal; Notable for the following components:      Result Value   Sodium 132 (*)    Calcium 8.3 (*)    All other components within normal limits  CBC  TROPONIN I (HIGH SENSITIVITY)  TROPONIN I (HIGH SENSITIVITY)    EKG EKG Interpretation Date/Time:  Friday March 04 2023 16:17:19 EST Ventricular Rate:  64 PR Interval:  208 QRS Duration:  88 QT Interval:  418 QTC Calculation: 432 R Axis:   46  Text Interpretation: Sinus rhythm Borderline prolonged PR interval Low voltage, precordial leads No significant change since last tracing Confirmed by Richardean Canal [phone]) on 03/04/2023 4:53:39 PM  Radiology DG Chest 2 View Result Date: 03/04/2023 CLINICAL DATA:  chest pain EXAM: CHEST - 2 VIEW COMPARISON:  11/15/2020. FINDINGS: Cardiac silhouette enlarged. No  evidence of pneumothorax or pleural effusion. No evidence of pulmonary edema. No osseous abnormalities identified. IMPRESSION: Enlarged cardiac silhouette.  Lungs are clear. Electronically Signed   By: Layla Maw M.D.   On: 03/04/2023 16:59    Procedures Procedures    Medications Ordered in ED Medications  albuterol (PROVENTIL) (2.5 MG/3ML) 0.083% nebulizer solution 5 mg (5 mg Nebulization Given 03/04/23 1721)    ED Course/ Medical Decision Making/ A&P                                 Medical Decision Making MAYTHE GOEDE is a 47 y.o. female here presenting with shortness of breath and chest pain.  Patient does have a contrast dye allergy.  I think likely bronchitis versus ACS.  I have low suspicion for PE or dissection.  Plan to get CBC and CMP and troponin x 2 and chest x-ray.  6:33 PM Chest x-ray showed enlarged cardiac silhouette.  Since patient has a contrast dye allergy we will get CT chest without contrast to look for pericardial effusion.  7:48 PM Troponin negative x 2.  CT did not show any pericardial effusion.  I wonder if patient has chest wall muscle strain versus asthma exacerbation.  Plan to discharge home with course of steroids and muscle relaxants. Patient now has some headaches so will give reglan as well    Problems Addressed: Chest wall pain: acute illness or injury Mild intermittent asthma with acute exacerbation: acute illness or injury  Amount and/or Complexity of Data Reviewed Labs: ordered. Decision-making details documented in ED Course. Radiology: ordered and independent interpretation performed. Decision-making details documented in ED Course.  Risk Prescription drug management.    Final Clinical Impression(s) / ED Diagnoses Final diagnoses:  None    Rx / DC Orders ED Discharge Orders     None         Charlynne Pander, MD 03/04/23 1949

## 2023-03-04 NOTE — ED Triage Notes (Signed)
Pt arrived via POV. C/o intermittent cental chest pain, and L arm pain for 3x days.  No cardiac hx  AOx4

## 2023-03-04 NOTE — Discharge Instructions (Addendum)
As we discussed your CT scan and your heart enzyme test is normal.  You may have mild asthma exacerbation so I prescribed some steroids.  I also have prescribed some muscle relaxant as needed  See your doctor for follow-up  Return to ER if you have worse shortness of breath or chest pain or fever

## 2023-04-05 ENCOUNTER — Encounter (HOSPITAL_BASED_OUTPATIENT_CLINIC_OR_DEPARTMENT_OTHER): Payer: Self-pay | Admitting: Emergency Medicine

## 2023-04-05 ENCOUNTER — Emergency Department (HOSPITAL_BASED_OUTPATIENT_CLINIC_OR_DEPARTMENT_OTHER): Payer: No Typology Code available for payment source

## 2023-04-05 ENCOUNTER — Emergency Department (HOSPITAL_BASED_OUTPATIENT_CLINIC_OR_DEPARTMENT_OTHER)
Admission: EM | Admit: 2023-04-05 | Discharge: 2023-04-05 | Disposition: A | Discharge status: Home / Self Care | Payer: No Typology Code available for payment source | Attending: Emergency Medicine | Admitting: Emergency Medicine

## 2023-04-05 ENCOUNTER — Other Ambulatory Visit: Payer: Self-pay

## 2023-04-05 DIAGNOSIS — M7989 Other specified soft tissue disorders: Secondary | ICD-10-CM | POA: Diagnosis present

## 2023-04-05 DIAGNOSIS — R079 Chest pain, unspecified: Secondary | ICD-10-CM | POA: Diagnosis not present

## 2023-04-05 DIAGNOSIS — R600 Localized edema: Secondary | ICD-10-CM | POA: Insufficient documentation

## 2023-04-05 LAB — CBC WITH DIFFERENTIAL/PLATELET
Abs Immature Granulocytes: 0.02 10*3/uL (ref 0.00–0.07)
Basophils Absolute: 0 10*3/uL (ref 0.0–0.1)
Basophils Relative: 1 %
Eosinophils Absolute: 0.2 10*3/uL (ref 0.0–0.5)
Eosinophils Relative: 4 %
HCT: 37.6 % (ref 36.0–46.0)
Hemoglobin: 12.4 g/dL (ref 12.0–15.0)
Immature Granulocytes: 0 %
Lymphocytes Relative: 25 %
Lymphs Abs: 1.5 10*3/uL (ref 0.7–4.0)
MCH: 30.1 pg (ref 26.0–34.0)
MCHC: 33 g/dL (ref 30.0–36.0)
MCV: 91.3 fL (ref 80.0–100.0)
Monocytes Absolute: 0.5 10*3/uL (ref 0.1–1.0)
Monocytes Relative: 8 %
Neutro Abs: 3.8 10*3/uL (ref 1.7–7.7)
Neutrophils Relative %: 62 %
Platelets: 255 10*3/uL (ref 150–400)
RBC: 4.12 MIL/uL (ref 3.87–5.11)
RDW: 14.3 % (ref 11.5–15.5)
WBC: 6.1 10*3/uL (ref 4.0–10.5)
nRBC: 0 % (ref 0.0–0.2)

## 2023-04-05 LAB — BASIC METABOLIC PANEL
Anion gap: 4 — ABNORMAL LOW (ref 5–15)
BUN: 14 mg/dL (ref 6–20)
CO2: 28 mmol/L (ref 22–32)
Calcium: 8.2 mg/dL — ABNORMAL LOW (ref 8.9–10.3)
Chloride: 103 mmol/L (ref 98–111)
Creatinine, Ser: 0.66 mg/dL (ref 0.44–1.00)
GFR, Estimated: >60 mL/min (ref >60)
Glucose, Bld: 87 mg/dL (ref 70–99)
Potassium: 4.3 mmol/L (ref 3.5–5.1)
Sodium: 135 mmol/L (ref 135–145)

## 2023-04-05 LAB — BRAIN NATRIURETIC PEPTIDE: B Natriuretic Peptide: 92 pg/mL (ref 0.0–100.0)

## 2023-04-05 LAB — TROPONIN I (HIGH SENSITIVITY): Troponin I (High Sensitivity): 4 ng/L (ref <18)

## 2023-04-05 MED ORDER — FUROSEMIDE 40 MG PO TABS
40.0000 mg | ORAL_TABLET | Freq: Once | ORAL | Status: AC
  Administered 2023-04-05: 40 mg via ORAL
  Filled 2023-04-05: qty 1

## 2023-04-05 MED ORDER — POTASSIUM CHLORIDE CRYS ER 20 MEQ PO TBCR: 20.0000 meq | EXTENDED_RELEASE_TABLET | Freq: Two times a day (BID) | ORAL | 0 refills | Status: AC

## 2023-04-05 MED ORDER — FUROSEMIDE 40 MG PO TABS
40.0000 mg | ORAL_TABLET | Freq: Every day | ORAL | 0 refills | Status: AC
Start: 2023-04-05 — End: ?

## 2023-04-05 NOTE — ED Triage Notes (Signed)
Pt  c/o CP (upper middle), describes as sharp and  LT side jaw pain intermittently x 2 wks;  she is most concerned about BLE swelling x 1 wk; c/o HA

## 2023-04-05 NOTE — Discharge Instructions (Addendum)
You were seen in the ER for chest discomfort, leg swelling.  Please take the Lasix as prescribed for the next 5 days. Our workup is not revealing any evidence of renal failure or heart failure.  We suspect that you might have venous stasis edema.  Please start wearing compression stockings every day, at nighttime you may remove the compression stockings but elevate the legs when you go to bed.  Reduce salt in your diet.

## 2023-04-05 NOTE — ED Provider Notes (Signed)
[place] [facility] AT [facility] Provider Note   CSN: [id number] Arrival date & time: 04/05/23  1712     History  Chief Complaint  Patient presents with   Chest Pain   Leg Swelling    Julie Munoz is a 48 y.o. female.  HPI    48 year old patient comes in with chief complaint of chest pain, leg swelling. Patient states that she has been having intermittent chest pain for the last 2 weeks.  Her main concern however is that she has lower extremity swelling that has been present for the last week.  Patient was seen in the ER in December for chest pain, was told that her cardiac workup was reassuring but she might have enlarged heart.  She has called cardiology, but has an appointment in March.  She was advised by cardiology service to have a ER put in a referral if she has to return to the ER.  She is requesting that she get a referral so that she can be seen quickly by them, she does not have a PCP.  Additionally, for the last week patient has been having leg swelling.  The swelling is leading to increased pain and discomfort.  She has no history of liver disease, kidney disease.  Home Medications Prior to Admission medications   Medication Sig Start Date End Date Taking? Authorizing Provider  furosemide (LASIX) 40 MG tablet Take 1 tablet (40 mg total) by mouth daily. 04/05/23  Yes Darrill Vreeland, MD  potassium chloride SA (KLOR-CON M) 20 MEQ tablet Take 1 tablet (20 mEq total) by mouth 2 (two) times daily. 04/05/23  Yes Derwood Kaplan, MD  albuterol (PROVENTIL HFA;VENTOLIN HFA) 108 (90 Base) MCG/ACT inhaler Inhale 1-2 puffs into the lungs every 6 (six) hours as needed for wheezing or shortness of breath.    [provider]  albuterol (PROVENTIL) (2.5 MG/3ML) 0.083% nebulizer solution Take 2.5 mg by nebulization every 6 (six) hours as needed for wheezing or shortness of breath.    [provider]  cyclobenzaprine (FLEXERIL) 5 MG tablet  Take 1 tablet (5 mg total) by mouth 3 (three) times daily as needed for muscle spasms. 03/04/23   Charlynne Pander, MD  HYDROcodone-acetaminophen (NORCO/VICODIN) 5-325 MG tablet Take 1 tablet by mouth at bedtime as needed for moderate pain or severe pain. 01/10/18   Mardella Layman, MD  predniSONE (DELTASONE) 20 MG tablet Take 60 mg daily x 2 days then 40 mg daily x 2 days then 20 mg daily x 2 days 03/04/23   Charlynne Pander, MD  triamcinolone cream (KENALOG) 0.1 % Apply 1 application topically 2 (two) times daily. 07/09/20   Particia Nearing, PA-C      Allergies    Iodine, Shellfish allergy, Other, and Tramadol    Review of Systems   Review of Systems  All other systems reviewed and are negative.   Physical Exam Updated Vital Signs BP (!) 143/77 (BP Location: Right Arm)   Pulse 67   Temp 98.5 F (36.9 C) (Oral)   Resp (!) 22   Ht 5\' 7"  (1.702 m)   Wt 113.4 kg   LMP 12/26/2012   SpO2 98%   BMI 39.16 kg/m  Physical Exam Vitals and nursing note reviewed.  Constitutional:      Appearance: She is well-developed.  HENT:     Head: Atraumatic.  Cardiovascular:     Rate and Rhythm: Normal rate.  Pulmonary:     Effort: Pulmonary  effort is normal.     Breath sounds: Normal breath sounds.  Musculoskeletal:     Cervical back: Normal range of motion and neck supple.     Right lower leg: Edema present.     Left lower leg: Edema present.  Skin:    General: Skin is warm and dry.  Neurological:     Mental Status: She is alert and oriented to person, place, and time.     ED Results / Procedures / Treatments   Labs (all labs ordered are listed, but only abnormal results are displayed) Labs Reviewed  BASIC METABOLIC PANEL - Abnormal; Notable for the following components:      Result Value   Calcium 8.2 (*)    Anion gap 4 (*)    All other components within normal limits  CBC WITH DIFFERENTIAL/PLATELET  BRAIN NATRIURETIC PEPTIDE  TROPONIN I (HIGH SENSITIVITY)  TROPONIN I  (HIGH SENSITIVITY)    EKG EKG Interpretation Date/Time:  Tuesday April 05 2023 17:25:20 EST Ventricular Rate:  67 PR Interval:  210 QRS Duration:  80 QT Interval:  393 QTC Calculation: 415 R Axis:   39  Text Interpretation: Sinus rhythm Prolonged PR interval Low voltage, precordial leads No acute changes No significant change since last tracing Confirmed by Derwood Kaplan [phone]) on 04/05/2023 7:40:10 PM  Radiology DG Chest 2 View Result Date: 04/05/2023 CLINICAL DATA:  48 y/o female. Pt c/o CP (upper middle), describes as sharp and LT side jaw pain intermittently x 2 wks; she is most concerned about BLE swelling x 1 week. Hysterectomy. EXAM: CHEST - 2 VIEW COMPARISON:  Chest x-ray 03/04/2023, CT chest 03/04/2023 FINDINGS: The heart and mediastinal contours are within normal limits. No focal consolidation. No pulmonary edema. No pleural effusion. No pneumothorax. No acute osseous abnormality. IMPRESSION: No active cardiopulmonary disease. Electronically Signed   By: Tish Frederickson M.D.   On: 04/05/2023 17:42    Procedures Procedures    Medications Ordered in ED Medications  furosemide (LASIX) tablet 40 mg (has no administration in time range)    ED Course/ Medical Decision Making/ A&P                                 Medical Decision Making Amount and/or Complexity of Data Reviewed Labs: ordered. Radiology: ordered.  Risk Prescription drug management.  48 year old patient comes in with chief complaint of bilateral leg swelling, chest pain.  She was seen in the ER on 12-27 for chest pain and had cardiac workup that was reassuring.  Patient indicates multiple times, that the main reason she is here right now is for the leg swelling that has been bothering her for the last week.  For the chest pain, patient has single troponin which is normal.  EKG is reassuring. I do not think additional cardiac workup is needed.  For the leg swelling, differential diagnosis includes  lymphedema, venous stasis, peripheral edema from renal issues, liver disease or high salt diet.  BNP is normal.  Patient does not have any renal issues.  She has no history of liver disease and denies any alcohol use disorder.  I do not think LFTs need to be added right now.  Suspect that most likely this is peripheral edema due to venous stasis or leaky valves.  We will advise compression stockings, leg elevation and give patient a burst dose of Lasix.  Final Clinical Impression(s) / ED Diagnoses Final diagnoses:  Peripheral  edema    Rx / DC Orders ED Discharge Orders          Ordered    Ambulatory referral to Cardiology       Comments: If you have not heard from the Cardiology office within the next 72 hours please call [phone].   04/05/23 1941    furosemide (LASIX) 40 MG tablet  Daily        04/05/23 1942    potassium chloride SA (KLOR-CON M) 20 MEQ tablet  2 times daily        04/05/23 1942              Derwood Kaplan, MD 04/05/23 1946

## 2023-05-27 DIAGNOSIS — R600 Localized edema: Secondary | ICD-10-CM | POA: Insufficient documentation

## 2023-05-27 DIAGNOSIS — Z9109 Other allergy status, other than to drugs and biological substances: Secondary | ICD-10-CM | POA: Insufficient documentation

## 2023-06-02 ENCOUNTER — Encounter: Payer: Self-pay | Admitting: Cardiology

## 2023-06-02 ENCOUNTER — Ambulatory Visit: Payer: No Typology Code available for payment source | Attending: Cardiology | Admitting: Cardiology

## 2023-06-02 VITALS — BP 130/74 | HR 64 | Ht 67.0 in | Wt 283.1 lb

## 2023-06-02 DIAGNOSIS — R600 Localized edema: Secondary | ICD-10-CM | POA: Diagnosis not present

## 2023-06-02 DIAGNOSIS — Z13220 Encounter for screening for lipoid disorders: Secondary | ICD-10-CM | POA: Diagnosis not present

## 2023-06-02 DIAGNOSIS — R0609 Other forms of dyspnea: Secondary | ICD-10-CM | POA: Diagnosis not present

## 2023-06-02 NOTE — Progress Notes (Signed)
 Cardiology Office Note:    Date:  06/02/2023   ID:  Julie Munoz, DOB [birth year], MRN [id number]  PCP:  Pcp, No  Cardiologist:  Garwin Brothers, MD   Referring MD: Derwood Kaplan, MD    ASSESSMENT:    1. Peripheral edema    PLAN:    In order of problems listed above:  Primary prevention stressed with the patient.  Importance of compliance with diet medication stressed and patient verbalized standing. Dyspnea on exertion: Unlikely to be cardiac.  CT of the chest was unremarkable with no evidence of atherosclerosis mentioned in the report.  However in view of the symptoms and to reassure her and for an exercise stress echo. Cardiac murmur: Echocardiogram will be done to assess murmur heard on auscultation. Obesity: Risk explained diet emphasized and she promises to do better. Lipid screening: Reviewed lipids checked today.  Accordingly we will discuss with her based on the findings of her test results the plan of action.  Healthy diet was discussed already today. Patient will be seen in follow-up appointment in 6 months or earlier if the patient has any concerns.    Medication Adjustments/Labs and Tests Ordered: Current medicines are reviewed at length with the patient today.  Concerns regarding medicines are outlined above.  Orders Placed This Encounter  Procedures   EKG 12-Lead   No orders of the defined types were placed in this encounter.    History of Present Illness:    Julie Munoz is a 48 y.o. female who is being seen today for the evaluation of dyspnea on exertion and pedal edema at the request of Derwood Kaplan, MD. patient is a pleasant 48 year old female.  She has no history of hypertension diabetes mellitus or dyslipidemia.  She is a Investment banker, operational by profession.  She lives a sedentary lifestyle.  She mentions to me that she had some dyspnea on exertion and pedal edema she went to the emergency room and was evaluated.  I reviewed those notes.  She has no  chest pain orthopnea or PND.  At the time of my evaluation, the patient is alert awake oriented and in no distress.  Past Medical History:  Diagnosis Date   CAP (community acquired pneumonia) 02/02/2013   Environmental allergies    Fibroid uterus 01/18/2013   Fibroids, submucosal 08/08/2012   Pelvic pain in female 08/08/2012   Peripheral edema    Pulmonary nodule 02/02/2013    Past Surgical History:  Procedure Laterality Date   ABDOMINAL HYSTERECTOMY     BILATERAL SALPINGECTOMY Bilateral 01/18/2013   Procedure: BILATERAL SALPINGECTOMY;  Surgeon: Lavina Hamman, MD;  Location: [facility];  Service: Gynecology;  Laterality: Bilateral;   CESAREAN SECTION     CHOLECYSTECTOMY     CORNEAL TRANSPLANT     surgeries x 3, bilateral left x 2, right x 1   LAPAROSCOPIC ASSISTED VAGINAL HYSTERECTOMY N/A 01/18/2013   Procedure: LAPAROSCOPIC ASSISTED VAGINAL HYSTERECTOMY;  Surgeon: Lavina Hamman, MD;  Location: [facility];  Service: Gynecology;  Laterality: N/A;   LAPAROSCOPY N/A 08/08/2012   Procedure: LAPAROSCOPY OPERATIVE;  Surgeon: Lavina Hamman, MD;  Location: [facility];  Service: Gynecology;  Laterality: N/A;    Current Medications: Current Meds  Medication Sig   albuterol (PROVENTIL HFA;VENTOLIN HFA) 108 (90 Base) MCG/ACT inhaler Inhale 1-2 puffs into the lungs every 6 (six) hours as needed for wheezing or shortness of breath.   albuterol (PROVENTIL) (2.5 MG/3ML) 0.083% nebulizer solution Take 2.5 mg by nebulization every 6 (six) hours as needed  for wheezing or shortness of breath.   HYDROcodone-acetaminophen (NORCO/VICODIN) 5-325 MG tablet Take 1 tablet by mouth at bedtime as needed for moderate pain or severe pain.   predniSONE (DELTASONE) 20 MG tablet Take 60 mg daily x 2 days then 40 mg daily x 2 days then 20 mg daily x 2 days     Allergies:   Iodine, Shellfish allergy, Other, and Tramadol   Social History   Socioeconomic History   Marital status: Single    Spouse name: Not on file    Number of children: Not on file   Years of education: Not on file   Highest education level: Not on file  Occupational History   Not on file  Tobacco Use   Smoking status: Former    Current packs/day: 0.00    Average packs/day: 0.5 packs/day for 10.0 years (5.0 ttl pk-yrs)    Types: Cigarettes    Start date: 12/04/2002    Quit date: 12/03/2012    Years since quitting: 10.5   Smokeless tobacco: Never   Tobacco comments:    quit smoking in approx. Sept 2014  Vaping Use   Vaping status: Never Used  Substance and Sexual Activity   Alcohol use: Yes    Comment: socially   Drug use: No   Sexual activity: Yes    Birth control/protection: None  Other Topics Concern   Not on file  Social History Narrative   Not on file   Social Drivers of Health   Financial Resource Strain: Not on file  Food Insecurity: Not on file  Transportation Needs: Not on file  Physical Activity: Not on file  Stress: Not on file  Social Connections: Not on file     Family History: The patient's family history includes Asthma in her mother.  ROS:   Please see the history of present illness.    All other systems reviewed and are negative.  EKGs/Labs/Other Studies Reviewed:    The following studies were reviewed today:  EKG Interpretation Date/Time:  Thursday June 02 2023 08:55:16 EDT Ventricular Rate:  64 PR Interval:  200 QRS Duration:  72 QT Interval:  402 QTC Calculation: 414 R Axis:   41  Text Interpretation: Normal sinus rhythm Normal ECG When compared with ECG of 05-Apr-2023 17:25, PREVIOUS ECG IS PRESENT Confirmed by Belva Crome [phone]) on 06/02/2023 9:00:16 AM     Recent Labs: 04/05/2023: B Natriuretic Peptide 92.0; BUN 14; Creatinine, Ser 0.66; Hemoglobin 12.4; Platelets 255; Potassium 4.3; Sodium 135  Recent Lipid Panel No results found for: "CHOL", "TRIG", "HDL", "CHOLHDL", "VLDL", "LDLCALC", "LDLDIRECT"  Physical Exam:    VS:  BP 130/74   Pulse 64   Ht 5\' 7"  (1.702 m)    Wt 283 lb 1.9 oz (128.4 kg)   LMP 12/26/2012   SpO2 97%   BMI 44.34 kg/m     Wt Readings from Last 3 Encounters:  06/02/23 283 lb 1.9 oz (128.4 kg)  04/05/23 250 lb (113.4 kg)  03/04/23 265 lb (120.2 kg)     GEN: Patient is in no acute distress HEENT: Normal NECK: No JVD; No carotid bruits LYMPHATICS: No lymphadenopathy CARDIAC: S1 S2 regular, 2/6 systolic murmur at the apex. RESPIRATORY:  Clear to auscultation without rales, wheezing or rhonchi  ABDOMEN: Soft, non-tender, non-distended MUSCULOSKELETAL:  No edema; No deformity  SKIN: Warm and dry NEUROLOGIC:  Alert and oriented x 3 PSYCHIATRIC:  Normal affect    Signed, Garwin Brothers, MD  06/02/2023 9:04 AM  [facility]

## 2023-06-02 NOTE — Patient Instructions (Addendum)
 Medication Instructions:  Your physician recommends that you continue on your current medications as directed. Please refer to the Current Medication list given to you today.  *If you need a refill on your cardiac medications before your next appointment, please call your pharmacy*   Lab Work: Your physician recommends that you have a CMP and Lipids today in the office.  If you have labs (blood work) drawn today and your tests are completely normal, you will receive your results only by: MyChart Message (if you have MyChart) OR A paper copy in the mail If you have any lab test that is abnormal or we need to change your treatment, we will call you to review the results.   Testing/Procedures:    [facility] [address] [place], [place] [address] Phone:  [phone]  June 02, 2023    Julie Munoz DOB: 1976/03/07 MRN: [id number] [address] [place] [address]   Stress Echocardiogram Instructions:    1. You may take all of your medications.  2. No food, drink or tobacco products four hours prior to your test.  3. Dress prepared to exercise. Best to wear 2 piece outfit and tennis shoes. Shoes must be closed toe.  4. Please bring all current prescription medications.  Please report to [address], [address] for your test.  If you have questions or concerns about your appointment, you can call the Nuclear Lab at [phone].  If you cannot keep your appointment, please provide 24 hours notification to the Nuclear Lab, to avoid a possible $50 charge to your account.  Your physician has requested that you have an echocardiogram. Echocardiography is a painless test that uses sound waves to create images of your heart. It provides your doctor with information about the size and shape of your heart and how well your heart's chambers and valves are working. This procedure takes approximately one hour. There are no  restrictions for this procedure. Please do NOT wear cologne, perfume, aftershave, or lotions (deodorant is allowed). Please arrive 15 minutes prior to your appointment time.  Please note: We ask at that you not bring children with you during ultrasound (echo/ vascular) testing. Due to room size and safety concerns, children are not allowed in the ultrasound rooms during exams. Our front office staff cannot provide observation of children in our lobby area while testing is being conducted. An adult accompanying a patient to their appointment will only be allowed in the ultrasound room at the discretion of the ultrasound technician under special circumstances. We apologize for any inconvenience.   Follow-Up: At [facility], you and your health needs are our priority.  As part of our continuing mission to provide you with exceptional heart care, we have created designated Provider Care Teams.  These Care Teams include your primary Cardiologist (physician) and Advanced Practice Providers (APPs -  Physician Assistants and Nurse Practitioners) who all work together to provide you with the care you need, when you need it.  We recommend signing up for the patient portal called "MyChart".  Sign up information is provided on this After Visit Summary.  MyChart is used to connect with patients for Virtual Visits (Telemedicine).  Patients are able to view lab/test results, encounter notes, upcoming appointments, etc.  Non-urgent messages can be sent to your provider as well.   To learn more about what you can do with MyChart, go to [email or web address].    Your next appointment:  9 month(s)  The format for your next appointment:   In Person  Provider:   Belva Crome, MD   Other Instructions  Exercise Stress Echocardiogram An exercise stress echocardiogram is a test to check how well your heart is working. This test uses sound waves and a computer to make pictures of your heart. These pictures  will be taken before and after you exercise. For this test, you will walk on a treadmill or ride a bicycle to make your heart beat faster. While you exercise, your heart will be checked with an electrocardiogram (ECG). Your blood pressure will also be checked. You may have this test if: You have chest pain or a heart problem. You had a heart attack or heart surgery not long ago. You have heart valve problems. You have a condition that causes narrowing of the blood vessels that supply your heart. You have a high risk of heart disease and: You are starting a new exercise program. You need to have a big surgery. Tell a doctor about: Any allergies you have. All medicines you are taking. This includes vitamins, herbs, eye drops, creams, and over-the-counter medicines. Any problems you or family members have had with medicines that make you fall asleep (anesthetic medicines). Any surgeries you have had. Any blood disorders you have. Any medical conditions you have. Whether you are pregnant or may be pregnant. What are the risks? Generally, this is a safe test. However, problems may occur, including: Chest pain. Feeling dizzy or light-headed. Shortness of breath. Increased or irregular heartbeat. Feeling like you may vomit (nausea) or vomiting. Heart attack. This is very rare. What happens before the test? Medicines Ask your doctor about changing or stopping your normal medicines. This is important if you take diabetes medicines or blood thinners. If you use an inhaler, bring it to the test. General instructions Wear comfortable clothes and walking shoes. Follow instructions from your doctor about what you cannot eat or drink before the test. Do not drink or eat anything that has caffeine in it. Stop having caffeine 24 hours before the test. Do not smoke or use products that contain nicotine or tobacco for 4 hours before the test. If you need help quitting, ask your doctor. What happens  during the test?  You will take off your clothes from the waist up and put on a hospital gown. Electrodes or patches will be put on your chest. A blood pressure cuff will be put on your arm. Before you exercise, a computer will make a picture of your heart. To do this: You will lie down and a gel will be put on your chest. A wand will be moved over the gel. Sound waves from the wand will go to the computer to make the picture. Then, you will start to exercise. You may walk on a treadmill or pedal a bicycle. Your blood pressure and heart rhythm will be checked while you exercise. The exercise will get harder or faster. You will exercise until: Your heart reaches a certain level. You are too tired to go on. You cannot go on because of chest pain, weakness, or dizziness. You will lie down right away so another picture of your heart can be taken. The procedure may vary among doctors and hospitals. What can I expect after the test? After your test, it is common to have: Mild soreness. Mild tiredness. Your heart rate and blood pressure will be checked until they return to your normal levels. You should not have  any new symptoms after this test. Follow these instructions at home: If your doctor says that you can, you may: Eat what you normally eat. Do your normal activities. Take over-the-counter and prescription medicines only as told by your doctor. Keep all follow-up visits. It is up to you to get the results of your test. Ask how to get your results when they are ready. Contact a doctor if: You feel dizzy or light-headed. You have a fast or irregular heartbeat. You feel like you may vomit or you vomit. You have a headache. You feel short of breath. Get help right away if: You develop pain or pressure: In your chest. In your jaw or neck. Between your shoulders. That goes down your left arm. You faint. You have trouble breathing. These symptoms may be an emergency. Get medical  help right away. Call your local emergency services (911 in the [place] Do not wait to see if the symptoms will go away. Do not drive yourself to the hospital. Summary This is a test that checks how well your heart is working. Follow instructions about what you cannot eat or drink before the test. Ask your doctor if you should take your normal medicines before the test. Stop having caffeine 24 hours before the test. Do not smoke or use products with nicotine or tobacco in them for 4 hours before the test. During the test, your blood pressure and heart rhythm will be checked while you exercise. This information is not intended to replace advice given to you by your health care provider. Make sure you discuss any questions you have with your health care provider. Document Revised: 11/05/2020 Document Reviewed: 10/16/2019 Elsevier Patient Education  2022 [employer].

## 2023-06-06 ENCOUNTER — Other Ambulatory Visit: Payer: Self-pay

## 2023-06-06 ENCOUNTER — Encounter (HOSPITAL_BASED_OUTPATIENT_CLINIC_OR_DEPARTMENT_OTHER): Payer: Self-pay | Admitting: Urology

## 2023-06-06 ENCOUNTER — Emergency Department (HOSPITAL_BASED_OUTPATIENT_CLINIC_OR_DEPARTMENT_OTHER)

## 2023-06-06 ENCOUNTER — Emergency Department (HOSPITAL_BASED_OUTPATIENT_CLINIC_OR_DEPARTMENT_OTHER)
Admission: EM | Admit: 2023-06-06 | Discharge: 2023-06-06 | Disposition: A | Discharge status: Home / Self Care | Attending: Emergency Medicine | Admitting: Emergency Medicine

## 2023-06-06 DIAGNOSIS — S61211A Laceration without foreign body of left index finger without damage to nail, initial encounter: Secondary | ICD-10-CM | POA: Diagnosis not present

## 2023-06-06 DIAGNOSIS — W260XXA Contact with knife, initial encounter: Secondary | ICD-10-CM | POA: Diagnosis not present

## 2023-06-06 DIAGNOSIS — S6992XA Unspecified injury of left wrist, hand and finger(s), initial encounter: Secondary | ICD-10-CM | POA: Diagnosis present

## 2023-06-06 MED ORDER — LIDOCAINE HCL (PF) 1 % IJ SOLN
5.0000 mL | Freq: Once | INTRAMUSCULAR | Status: AC
  Administered 2023-06-06: 5 mL
  Filled 2023-06-06: qty 5

## 2023-06-06 NOTE — Discharge Instructions (Signed)
 You were evaluated the emergency room for a laceration.  This was sutured closed.  You may return here or present to urgent care or primary care office in 7 to 10 days for suture removal.  Please return if you experience any new or worsening symptoms including fevers, chills, worsening pain or swelling.

## 2023-06-06 NOTE — ED Triage Notes (Signed)
 Pt states cut right pointer finger with chef knife today  Bleeding controlled  Tdap UTD

## 2023-06-06 NOTE — ED Provider Notes (Addendum)
 [place] [facility] Provider Note   CSN: [id number] Arrival date & time: 06/06/23  1351     History  Chief Complaint  Patient presents with   Laceration    Julie Munoz is a 48 y.o. female with noncontributory medical history presents with complaints of laceration to her left index finger.  Patient was cutting an avocado when she sliced her finger.  Tetanus is up-to-date.   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 Authorizing Provider  albuterol (PROVENTIL HFA;VENTOLIN HFA) 108 (90 Base) MCG/ACT inhaler Inhale 1-2 puffs into the lungs every 6 (six) hours as needed for wheezing or shortness of breath.    [provider]  albuterol (PROVENTIL) (2.5 MG/3ML) 0.083% nebulizer solution Take 2.5 mg by nebulization every 6 (six) hours as needed for wheezing or shortness of breath.    [provider]  cyclobenzaprine (FLEXERIL) 5 MG tablet Take 1 tablet (5 mg total) by mouth 3 (three) times daily as needed for muscle spasms. Patient not taking: Reported on 06/02/2023 03/04/23   Charlynne Pander, MD  furosemide (LASIX) 40 MG tablet Take 1 tablet (40 mg total) by mouth daily. Patient not taking: Reported on 06/02/2023 04/05/23   Derwood Kaplan, MD  HYDROcodone-acetaminophen (NORCO/VICODIN) 5-325 MG tablet Take 1 tablet by mouth at bedtime as needed for moderate pain or severe pain. 01/10/18   Mardella Layman, MD  potassium chloride SA (KLOR-CON M) 20 MEQ tablet Take 1 tablet (20 mEq total) by mouth 2 (two) times daily. Patient not taking: Reported on 06/02/2023 04/05/23   Derwood Kaplan, MD   predniSONE (DELTASONE) 20 MG tablet Take 60 mg daily x 2 days then 40 mg daily x 2 days then 20 mg daily x 2 days 03/04/23   Charlynne Pander, MD  triamcinolone cream (KENALOG) 0.1 % Apply 1 application topically 2 (two) times daily. Patient not taking: Reported on 06/02/2023 07/09/20   Particia Nearing, PA-C      Allergies    Iodine, Shellfish allergy, Other, and Tramadol    Review of Systems   Review of Systems  Skin:  Positive for wound.    Physical Exam Updated Vital Signs BP 130/88 (BP Location: Left Arm)   Pulse 64   Temp 97.7 F (36.5 C)   Resp 18   Ht 5\' 7"  (1.702 m)   Wt 128.4 kg   LMP 12/26/2012   SpO2 99%   BMI 44.32 kg/m  Physical Exam Vitals and nursing note reviewed.  Constitutional:      General: She is not in acute distress.    Appearance: She is well-developed.  HENT:     Head: Normocephalic and atraumatic.  Eyes:     Conjunctiva/sclera: Conjunctivae normal.  Cardiovascular:     Rate and Rhythm: Normal rate.     Pulses: Normal pulses.  Pulmonary:     Effort: Pulmonary effort is normal. No respiratory distress.  Musculoskeletal:        General: No swelling.     Cervical back: Neck supple.     Comments: 1 cm laceration to lateral aspect of left index finger distal  phalanx.  Hemostasis is achieved.  No significant swelling, no retained foreign body visualized.  Skin:    General: Skin is warm and dry.     Capillary Refill: Capillary refill takes less than 2 seconds.  Neurological:     Mental Status: She is alert.  Psychiatric:        Mood and Affect: Mood normal.     ED Results / Procedures / Treatments   Labs (all labs ordered are listed, but only abnormal results are displayed) Labs Reviewed - No data to display  EKG None  Radiology DG Finger Index Left Result Date: 06/06/2023 CLINICAL DATA:  Laceration. EXAM: LEFT INDEX FINGER 3V COMPARISON:  None Available. FINDINGS: There is no evidence of fracture or dislocation. There is no  evidence of arthropathy or other focal bone abnormality. Soft tissues are unremarkable. Overlapping bandage. IMPRESSION: No acute osseous abnormality Electronically Signed   By: Karen Kays M.D.   On: 06/06/2023 15:02    Procedures .Laceration Repair  Date/Time: 06/06/2023 4:06 PM  Performed by: Halford Decamp, PA-C Authorized by: Halford Decamp, PA-C   Consent:    Consent obtained:  Verbal   Consent given by:  Patient   Risks, benefits, and alternatives were discussed: yes     Risks discussed:  Infection and pain   Alternatives discussed:  No treatment and observation Universal protocol:    Procedure explained and questions answered to patient or proxy's satisfaction: yes     Patient identity confirmed:  Verbally with patient and arm band Anesthesia:    Anesthesia method:  Local infiltration   Local anesthetic:  Lidocaine 1% w/o epi Laceration details:    Location:  Finger   Finger location:  L index finger   Length (cm):  1 Exploration:    Imaging obtained: x-ray     Imaging outcome: foreign body not noted   Treatment:    Area cleansed with:  Chlorhexidine and saline Skin repair:    Repair method:  Sutures and tissue adhesive   Suture size:  4-0   Suture material:  Prolene   Number of sutures:  1 Post-procedure details:    Procedure completion:  Tolerated     Medications Ordered in ED Medications  lidocaine (PF) (XYLOCAINE) 1 % injection 5 mL (5 mLs Infiltration Given by Other 06/06/23 1526)    ED Course/ Medical Decision Making/ A&P Clinical Course as of 06/06/23 1609  Mon Jun 06, 2023  1511 DG Finger Index Left [JT]    Clinical Course User Index [JT] Halford Decamp, PA-C                                 Medical Decision Making Amount and/or Complexity of Data Reviewed Radiology: ordered. Decision-making details documented in ED Course.  Risk Prescription drug management.   This patient presents to the ED with chief complaint(s) of  laceration .  The complaint involves an extensive differential diagnosis and also carries with it a high risk of complications and morbidity.   pertinent past medical history as listed in HPI  The differential diagnosis includes  Simple laceration, tendon/vascular/nerve involvement, retained foreign body  Additional history obtained: Records reviewed Care Everywhere/External Records  Initial Assessment:   Patient presents with simple laceration to left index finger.  Capable of making full fist.  DP pulses symmetric.  NVI, function intact.  Overall no suspicion for tendon/vascular/nerve involvement.  No evidence of retained  foreign body.  X-ray unremarkable.  Will closed with primary closure with suture.  Independent ECG interpretation:  none  Independent labs interpretation:  The following labs were independently interpreted:  none  Independent visualization and interpretation of imaging: I independently visualized the following imaging with scope of interpretation limited to determining acute life threatening conditions related to emergency care: finger xray, which revealed no acute osseous abnormality or retained foreign body  Treatment and Reassessment: Laceration repaired  Consultations obtained:   none  Disposition:   Patient will return in 7 to 10 days for suture removal. The patient has been appropriately medically screened and/or stabilized in the ED. I have low suspicion for any other emergent medical condition which would require further screening, evaluation or treatment in the ED or require inpatient management. At time of discharge the patient is hemodynamically stable and in no acute distress. I have discussed work-up results and diagnosis with patient and answered all questions. Patient is agreeable with discharge plan. We discussed strict return precautions for returning to the emergency department and they verbalized understanding.     Social Determinants of Health:    none  This note was dictated with voice recognition software.  Despite best efforts at proofreading, errors may have occurred which can change the documentation meaning.          Final Clinical Impression(s) / ED Diagnoses Final diagnoses:  Laceration of left index finger without foreign body without damage to nail, initial encounter    Rx / DC Orders ED Discharge Orders     None         Halford Decamp, PA-C 06/06/23 1610    Halford Decamp, PA-C 06/06/23 1611    Linwood Dibbles, MD 06/06/23 [phone]

## 2023-06-07 LAB — LIPID PANEL
Chol/HDL Ratio: 2.5 ratio (ref 0.0–4.4)
Cholesterol, Total: 149 mg/dL (ref 100–199)
HDL: 59 mg/dL (ref >39)
LDL Chol Calc (NIH): 76 mg/dL (ref 0–99)
Triglycerides: 70 mg/dL (ref 0–149)
VLDL Cholesterol Cal: 14 mg/dL (ref 5–40)

## 2023-06-07 LAB — COMPREHENSIVE METABOLIC PANEL WITH GFR
ALT: 11 IU/L (ref 0–32)
AST: 13 IU/L (ref 0–40)
Albumin: 3.7 g/dL — ABNORMAL LOW (ref 3.9–4.9)
Alkaline Phosphatase: 66 IU/L (ref 44–121)
BUN/Creatinine Ratio: 14 (ref 9–23)
BUN: 10 mg/dL (ref 6–24)
Bilirubin Total: <0.2 mg/dL (ref 0.0–1.2)
CO2: 23 mmol/L (ref 20–29)
Calcium: 8.7 mg/dL (ref 8.7–10.2)
Chloride: 105 mmol/L (ref 96–106)
Creatinine, Ser: 0.73 mg/dL (ref 0.57–1.00)
Globulin, Total: 3 g/dL (ref 1.5–4.5)
Glucose: 96 mg/dL (ref 70–99)
Potassium: 4.5 mmol/L (ref 3.5–5.2)
Sodium: 140 mmol/L (ref 134–144)
Total Protein: 6.7 g/dL (ref 6.0–8.5)
eGFR: 103 mL/min/{1.73_m2} (ref >59)

## 2023-06-20 ENCOUNTER — Telehealth: Payer: Self-pay

## 2023-06-20 ENCOUNTER — Other Ambulatory Visit: Payer: Self-pay

## 2023-06-20 ENCOUNTER — Ambulatory Visit (HOSPITAL_BASED_OUTPATIENT_CLINIC_OR_DEPARTMENT_OTHER)
Admission: RE | Admit: 2023-06-20 | Discharge: 2023-06-20 | Disposition: A | Discharge status: Home / Self Care | Source: Ambulatory Visit | Attending: Cardiology | Admitting: Cardiology

## 2023-06-20 ENCOUNTER — Emergency Department (HOSPITAL_BASED_OUTPATIENT_CLINIC_OR_DEPARTMENT_OTHER)
Admission: EM | Admit: 2023-06-20 | Discharge: 2023-06-20 | Disposition: A | Discharge status: Home / Self Care | Attending: Emergency Medicine | Admitting: Emergency Medicine

## 2023-06-20 ENCOUNTER — Encounter (HOSPITAL_BASED_OUTPATIENT_CLINIC_OR_DEPARTMENT_OTHER): Payer: Self-pay

## 2023-06-20 DIAGNOSIS — Z4802 Encounter for removal of sutures: Secondary | ICD-10-CM | POA: Insufficient documentation

## 2023-06-20 DIAGNOSIS — R0609 Other forms of dyspnea: Secondary | ICD-10-CM | POA: Insufficient documentation

## 2023-06-20 DIAGNOSIS — Z87891 Personal history of nicotine dependence: Secondary | ICD-10-CM | POA: Diagnosis not present

## 2023-06-20 LAB — ECHOCARDIOGRAM COMPLETE
AR max vel: 2.42 cm2
AV Area VTI: 2.38 cm2
AV Area mean vel: 2.56 cm2
AV Mean grad: 6 mmHg
AV Peak grad: 9.9 mmHg
Ao pk vel: 1.57 m/s
Area-P 1/2: 3.5 cm2
Calc EF: 69.4 %
MV M vel: 1.64 m/s
MV Peak grad: 10.8 mmHg
S' Lateral: 2.7 cm
Single Plane A2C EF: 67.4 %
Single Plane A4C EF: 71.7 %

## 2023-06-20 NOTE — ED Notes (Signed)
 Discharge instructions reviewed with patient. Patient verbalizes understanding, no further questions at this time. Medications and follow up information provided. No acute distress noted at time of departure.

## 2023-06-20 NOTE — Telephone Encounter (Signed)
-----   Message from Micael Adas Revankar sent at 06/20/2023  3:52 PM EDT ----- The results of the study is unremarkable. Please inform patient. I will discuss in detail at next appointment. Cc  primary care/referring physician Nelia Balzarine, MD 06/20/2023 3:52 PM

## 2023-06-20 NOTE — ED Triage Notes (Signed)
 Here for suture removal to left index finger, sutures placed 3/31.

## 2023-06-20 NOTE — Telephone Encounter (Signed)
 Left vm 4/14

## 2023-06-20 NOTE — ED Provider Notes (Signed)
 [place] [facility] Provider Note   CSN: [id number] Arrival date & time: 06/20/23  9562     History  Chief Complaint  Patient presents with   Suture / Staple Removal    Julie Munoz is a 48 y.o. female.  48 yo F with a chief complaints of needing a stitch removed.  The patient had cut her finger about 2 weeks ago and came into this emergency department and had a suture placed.  She has had no issues with it.  Is here for removal.   Suture / Staple Removal       Home Medications Prior to Admission medications   Medication Sig Start Date End Date Taking? Authorizing Provider  albuterol (PROVENTIL HFA;VENTOLIN HFA) 108 (90 Base) MCG/ACT inhaler Inhale 1-2 puffs into the lungs every 6 (six) hours as needed for wheezing or shortness of breath.    [provider]  albuterol (PROVENTIL) (2.5 MG/3ML) 0.083% nebulizer solution Take 2.5 mg by nebulization every 6 (six) hours as needed for wheezing or shortness of breath.    [provider]  cyclobenzaprine (FLEXERIL) 5 MG tablet Take 1 tablet (5 mg total) by mouth 3 (three) times daily as needed for muscle spasms. Patient not taking: Reported on 06/02/2023 03/04/23   Charlynne Pander, MD  furosemide (LASIX) 40 MG tablet Take 1 tablet (40 mg total) by mouth daily. Patient not taking: Reported on 06/02/2023 04/05/23   Derwood Kaplan, MD  HYDROcodone-acetaminophen (NORCO/VICODIN) 5-325 MG tablet Take 1 tablet by mouth at bedtime as needed for moderate pain or severe pain. 01/10/18   Mardella Layman, MD  potassium chloride SA (KLOR-CON M) 20 MEQ tablet Take 1 tablet (20 mEq total) by mouth 2 (two) times daily. Patient not taking: Reported on 06/02/2023 04/05/23   Derwood Kaplan, MD  predniSONE (DELTASONE) 20 MG tablet Take 60 mg daily x 2 days then 40 mg daily x 2 days then 20 mg daily x 2 days 03/04/23   Charlynne Pander, MD  triamcinolone cream (KENALOG) 0.1 % Apply 1 application  topically 2 (two) times daily. Patient not taking: Reported on 06/02/2023 07/09/20   Particia Nearing, PA-C      Allergies    Iodine, Shellfish allergy, Other, and Tramadol    Review of Systems   Review of Systems  Physical Exam Updated Vital Signs BP (!) 140/83 (BP Location: Right Arm)   Pulse (!) 56   Temp 98.5 F (36.9 C) (Oral)   Resp 18   Ht 5\' 7"  (1.702 m)   Wt 128.4 kg   LMP 12/26/2012   SpO2 100%   BMI 44.32 kg/m  Physical Exam Vitals and nursing note reviewed.  Constitutional:      General: She is not in acute distress.    Appearance: She is well-developed. She is not diaphoretic.  HENT:     Head: Normocephalic and atraumatic.  Eyes:     Pupils: Pupils are equal, round, and reactive to light.  Cardiovascular:     Rate and Rhythm: Normal rate and regular rhythm.     Heart sounds: No murmur heard.    No friction rub. No gallop.  Pulmonary:     Effort: Pulmonary effort is normal.     Breath sounds: No wheezing or rales.  Abdominal:     General: There is no distension.     Palpations: Abdomen is soft.     Tenderness: There is no abdominal tenderness.  Musculoskeletal:  General: No tenderness.     Cervical back: Normal range of motion and neck supple.     Comments: sutured incision noted along the radial aspect of the left second digit.  Spares the nail.  No erythema no drainage.  Skin:    General: Skin is warm and dry.  Neurological:     Mental Status: She is alert and oriented to person, place, and time.  Psychiatric:        Behavior: Behavior normal.     ED Results / Procedures / Treatments   Labs (all labs ordered are listed, but only abnormal results are displayed) Labs Reviewed - No data to display  EKG None  Radiology No results found.  Procedures Procedures    Medications Ordered in ED Medications - No data to display  ED Course/ Medical Decision Making/ A&P                                 Medical Decision Making  48  yo F here for suture removal.  Removed by nursing staff.  No signs of infection.  Discharge home.  PCP follow-up.  8:15 AM:  I have discussed the diagnosis/risks/treatment options with the patient.  Evaluation and diagnostic testing in the emergency department does not suggest an emergent condition requiring admission or immediate intervention beyond what has been performed at this time.  They will follow up with PCP. We also discussed returning to the ED immediately if new or worsening sx occur. We discussed the sx which are most concerning (e.g., sudden worsening pain, fever, inability to tolerate by mouth) that necessitate immediate return. Medications administered to the patient during their visit and any new prescriptions provided to the patient are listed below.  Medications given during this visit Medications - No data to display   The patient appears reasonably screen and/or stabilized for discharge and I doubt any other medical condition or other [facility] requiring further screening, evaluation, or treatment in the ED at this time prior to discharge.          Final Clinical Impression(s) / ED Diagnoses Final diagnoses:  Visit for suture removal    Rx / DC Orders ED Discharge Orders     None         Albertus Hughs, DO 06/20/23 [id number]

## 2023-06-20 NOTE — ED Notes (Signed)
 One suture removed rt index finger w/o diff area healing no s/s of infection pus or redness

## 2023-06-22 ENCOUNTER — Telehealth (HOSPITAL_COMMUNITY): Payer: Self-pay | Admitting: *Deleted

## 2023-06-22 NOTE — Telephone Encounter (Signed)
 Patient given detailed instructions per Stress Test Requisition Sheet for test on 06/27/2023 at 2:30.Patient Notified to arrive 30 minutes early, and that it is imperative to arrive on time for appointment to keep from having the test rescheduled.  Patient verbalized understanding. Anne Barrios

## 2023-06-27 ENCOUNTER — Ambulatory Visit (HOSPITAL_COMMUNITY)
Admission: RE | Admit: 2023-06-27 | Discharge: 2023-06-27 | Disposition: A | Discharge status: Home / Self Care | Source: Ambulatory Visit | Attending: Cardiology | Admitting: Cardiology

## 2023-06-27 ENCOUNTER — Ambulatory Visit (HOSPITAL_COMMUNITY)

## 2023-06-27 DIAGNOSIS — R0609 Other forms of dyspnea: Secondary | ICD-10-CM | POA: Diagnosis present

## 2023-06-27 MED ORDER — PERFLUTREN LIPID MICROSPHERE
1.0000 mL | INTRAVENOUS | Status: AC | PRN
  Administered 2023-06-27: 5 mL via INTRAVENOUS

## 2023-07-22 ENCOUNTER — Telehealth (HOSPITAL_COMMUNITY): Payer: Self-pay | Admitting: *Deleted

## 2023-07-22 NOTE — Telephone Encounter (Signed)
 Patient given detailed instructions per Stress Test Requisition Sheet for test on 07/29/2023 at 3:45.Patient Notified to arrive 30 minutes early, and that it is imperative to arrive on time for appointment to keep from having the test rescheduled.  Patient verbalized understanding. Julie Munoz

## 2023-07-29 ENCOUNTER — Ambulatory Visit (HOSPITAL_COMMUNITY): Admission: RE | Admit: 2023-07-29 | Source: Ambulatory Visit

## 2023-07-29 ENCOUNTER — Ambulatory Visit (HOSPITAL_COMMUNITY)

## 2023-08-16 ENCOUNTER — Telehealth (HOSPITAL_COMMUNITY): Payer: Self-pay

## 2023-08-16 NOTE — Telephone Encounter (Signed)
 Detailed instructions left on the patient's answering machine. Julie Munoz CCT

## 2023-08-19 ENCOUNTER — Other Ambulatory Visit (HOSPITAL_COMMUNITY)

## 2023-08-26 ENCOUNTER — Other Ambulatory Visit (HOSPITAL_COMMUNITY): Payer: Self-pay | Admitting: Cardiology

## 2023-08-26 ENCOUNTER — Ambulatory Visit (HOSPITAL_COMMUNITY): Admission: RE | Admit: 2023-08-26 | Source: Ambulatory Visit

## 2023-08-26 ENCOUNTER — Ambulatory Visit (HOSPITAL_COMMUNITY): Attending: Cardiology

## 2023-08-26 DIAGNOSIS — R0609 Other forms of dyspnea: Secondary | ICD-10-CM

## 2023-08-29 ENCOUNTER — Encounter (HOSPITAL_COMMUNITY): Payer: Self-pay | Admitting: Cardiology

## 2023-09-23 ENCOUNTER — Encounter: Payer: Self-pay | Admitting: Advanced Practice Midwife
